# Patient Record
Sex: Male | Born: 1970 | Race: White | Hispanic: No | Marital: Married | Smoking: Former smoker
Health system: Southern US, Community
[De-identification: ages and names within clinical notes are randomized; demographics above are authoritative.]

## PROBLEM LIST (undated history)

## (undated) DIAGNOSIS — N200 Calculus of kidney: Secondary | ICD-10-CM

## (undated) DIAGNOSIS — E785 Hyperlipidemia, unspecified: Secondary | ICD-10-CM

## (undated) HISTORY — PX: CARDIAC CATHETERIZATION: SHX172

## (undated) HISTORY — PX: OTHER SURGICAL HISTORY: SHX169

---

## 1998-12-13 ENCOUNTER — Emergency Department (HOSPITAL_COMMUNITY): Admission: EM | Admit: 1998-12-13 | Discharge: 1998-12-13 | Payer: Self-pay | Admitting: Emergency Medicine

## 1998-12-13 ENCOUNTER — Encounter: Payer: Self-pay | Admitting: Emergency Medicine

## 1998-12-20 ENCOUNTER — Encounter: Payer: Self-pay | Admitting: Emergency Medicine

## 1998-12-20 ENCOUNTER — Emergency Department (HOSPITAL_COMMUNITY): Admission: EM | Admit: 1998-12-20 | Discharge: 1998-12-20 | Payer: Self-pay | Admitting: Emergency Medicine

## 2004-07-16 ENCOUNTER — Emergency Department (HOSPITAL_COMMUNITY): Admission: EM | Admit: 2004-07-16 | Discharge: 2004-07-16 | Payer: Self-pay | Admitting: Emergency Medicine

## 2006-01-06 ENCOUNTER — Ambulatory Visit: Payer: Self-pay | Admitting: Internal Medicine

## 2006-01-16 ENCOUNTER — Ambulatory Visit: Payer: Self-pay | Admitting: Internal Medicine

## 2006-01-20 ENCOUNTER — Inpatient Hospital Stay (HOSPITAL_BASED_OUTPATIENT_CLINIC_OR_DEPARTMENT_OTHER): Admission: RE | Admit: 2006-01-20 | Discharge: 2006-01-20 | Payer: Self-pay | Admitting: Internal Medicine

## 2006-01-20 ENCOUNTER — Ambulatory Visit: Payer: Self-pay | Admitting: Internal Medicine

## 2006-02-03 ENCOUNTER — Encounter: Payer: Self-pay | Admitting: Cardiology

## 2006-02-03 ENCOUNTER — Ambulatory Visit: Payer: Self-pay

## 2006-02-23 ENCOUNTER — Ambulatory Visit: Payer: Self-pay | Admitting: Gastroenterology

## 2006-10-09 ENCOUNTER — Ambulatory Visit: Payer: Self-pay | Admitting: Internal Medicine

## 2006-10-09 LAB — CONVERTED CEMR LAB
Basophils Absolute: 0.1 10*3/uL (ref 0.0–0.1)
Basophils Relative: 0.7 % (ref 0.0–1.0)
Eosinophils Absolute: 0.2 10*3/uL (ref 0.0–0.6)
Eosinophils Relative: 2.4 % (ref 0.0–5.0)
Free T4: 0.7 ng/dL (ref 0.6–1.6)
HCT: 39.5 % (ref 39.0–52.0)
Hemoglobin: 13.6 g/dL (ref 13.0–17.0)
Lymphocytes Relative: 40.9 % (ref 12.0–46.0)
MCHC: 34.5 g/dL (ref 30.0–36.0)
MCV: 88.4 fL (ref 78.0–100.0)
Monocytes Absolute: 0.5 10*3/uL (ref 0.2–0.7)
Monocytes Relative: 7 % (ref 3.0–11.0)
Neutro Abs: 3.5 10*3/uL (ref 1.4–7.7)
Neutrophils Relative %: 49 % (ref 43.0–77.0)
Platelets: 273 10*3/uL (ref 150–400)
RBC: 4.47 M/uL (ref 4.22–5.81)
RDW: 12 % (ref 11.5–14.6)
T3, Free: 3.2 pg/mL (ref 2.3–4.2)
TSH: 1.64 microintl units/mL (ref 0.35–5.50)
WBC: 7.3 10*3/uL (ref 4.5–10.5)

## 2007-01-11 ENCOUNTER — Ambulatory Visit: Payer: Self-pay | Admitting: Internal Medicine

## 2007-01-12 LAB — CONVERTED CEMR LAB
ALT: 20 units/L (ref 0–40)
AST: 20 units/L (ref 0–37)
Albumin: 4.2 g/dL (ref 3.5–5.2)
Alkaline Phosphatase: 44 units/L (ref 39–117)
Bilirubin, Direct: 0.1 mg/dL (ref 0.0–0.3)
Cholesterol: 249 mg/dL (ref 0–200)
Direct LDL: 177 mg/dL
HDL: 37.6 mg/dL — ABNORMAL LOW (ref >39.0)
Total Bilirubin: 0.8 mg/dL (ref 0.3–1.2)
Total CHOL/HDL Ratio: 6.6
Total Protein: 7.2 g/dL (ref 6.0–8.3)
Triglycerides: 151 mg/dL — ABNORMAL HIGH (ref 0–149)
VLDL: 30 mg/dL (ref 0–40)

## 2007-03-23 ENCOUNTER — Ambulatory Visit: Payer: Self-pay | Admitting: Internal Medicine

## 2007-03-23 LAB — CONVERTED CEMR LAB
ALT: 22 units/L (ref 0–53)
AST: 23 units/L (ref 0–37)
Albumin: 4.1 g/dL (ref 3.5–5.2)
Alkaline Phosphatase: 51 units/L (ref 39–117)
BUN: 9 mg/dL (ref 6–23)
Bilirubin, Direct: 0.1 mg/dL (ref 0.0–0.3)
CO2: 32 meq/L (ref 19–32)
Calcium: 9.2 mg/dL (ref 8.4–10.5)
Chloride: 107 meq/L (ref 96–112)
Cholesterol: 158 mg/dL (ref 0–200)
Creatinine, Ser: 1 mg/dL (ref 0.4–1.5)
GFR calc Af Amer: 109 mL/min
GFR calc non Af Amer: 90 mL/min
Glucose, Bld: 109 mg/dL — ABNORMAL HIGH (ref 70–99)
HDL: 30.9 mg/dL — ABNORMAL LOW (ref >39.0)
LDL Cholesterol: 113 mg/dL — ABNORMAL HIGH (ref 0–99)
Potassium: 3.9 meq/L (ref 3.5–5.1)
Sodium: 143 meq/L (ref 135–145)
Total Bilirubin: 0.7 mg/dL (ref 0.3–1.2)
Total CHOL/HDL Ratio: 5.1
Total Protein: 6.8 g/dL (ref 6.0–8.3)
Triglycerides: 70 mg/dL (ref 0–149)
VLDL: 14 mg/dL (ref 0–40)

## 2009-03-19 ENCOUNTER — Telehealth: Payer: Self-pay | Admitting: Internal Medicine

## 2009-03-19 DIAGNOSIS — R000 Tachycardia, unspecified: Secondary | ICD-10-CM | POA: Insufficient documentation

## 2009-03-23 ENCOUNTER — Encounter: Payer: Self-pay | Admitting: Internal Medicine

## 2009-03-23 ENCOUNTER — Ambulatory Visit: Payer: Self-pay | Admitting: Internal Medicine

## 2009-03-23 ENCOUNTER — Ambulatory Visit: Payer: Self-pay

## 2009-03-29 DIAGNOSIS — G479 Sleep disorder, unspecified: Secondary | ICD-10-CM | POA: Insufficient documentation

## 2009-03-29 DIAGNOSIS — Z8719 Personal history of other diseases of the digestive system: Secondary | ICD-10-CM | POA: Insufficient documentation

## 2009-03-29 DIAGNOSIS — Z9109 Other allergy status, other than to drugs and biological substances: Secondary | ICD-10-CM | POA: Insufficient documentation

## 2009-03-29 DIAGNOSIS — Z8679 Personal history of other diseases of the circulatory system: Secondary | ICD-10-CM | POA: Insufficient documentation

## 2009-03-30 ENCOUNTER — Encounter: Payer: Self-pay | Admitting: Internal Medicine

## 2009-03-30 ENCOUNTER — Ambulatory Visit: Payer: Self-pay | Admitting: Internal Medicine

## 2009-03-30 ENCOUNTER — Encounter (INDEPENDENT_AMBULATORY_CARE_PROVIDER_SITE_OTHER): Payer: Self-pay | Admitting: *Deleted

## 2009-03-30 DIAGNOSIS — I495 Sick sinus syndrome: Secondary | ICD-10-CM | POA: Insufficient documentation

## 2010-12-17 NOTE — Letter (Signed)
March 30, 2009     RE:  [place], [place]  MRN:  [id number]  /  DOB:  [birth year]   To Whom It May Concern:   Mr. 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  However, he had no symptoms while wearing the monitor.   Correlation between his arrhythmias that are documented and his symptoms  which are concerning is imperative.  I request the use of an event  recorder to try to help establish that correlation.   I thank you in advance for your consideration of this matter.    Sincerely,      Duke Salvia, MD, [facility]  Electronically Signed    SCK/MedQ  DD: 03/30/2009  DT: 03/30/2009  Job #: [id number]

## 2010-12-20 NOTE — Assessment & Plan Note (Signed)
[facility]                           GASTROENTEROLOGY OFFICE NOTE   NAME:[place], [place] E                      MRN:          [id number]  DATE:02/23/2006                            DOB:          [birth year]    NEW GI CONSULTATION:   REFERRING PHYSICIAN:  Bevelyn Buckles. Bensimhon, MD   PRIMARY CARE PHYSICIAN:  Hector Shade, P.A., at [facility].   REASON FOR REFERRAL:  Dr. Gala Romney asked me to evaluate Mr. Knippel  regarding noncardiac chest pain.   HISTORY OF PRESENT ILLNESS:  Mr. Rentz is a very pleasant 40 year old man  who has had 6 months of intermittent left upper quadrant/left lower chest  pains.  The pains come on sometimes after eating, sometimes when lying down.  They are not reliably associated with any types of food or exertion.  The  pain is sharp, sometimes pleuritic, lasting anywhere from 2 to 30 minutes.  The pain at first was going 3-4 times a week and at its worst was almost  daily.  Given his strong family history for coronary artery disease (his  father died at 33 from an acute MI), he first was evaluated by Dr. Gala Romney  in cardiology, eventually having a cardiac catheterization, which was  essentially normal.  About 6 weeks ago at the beginning of this workup, he  was started on Protonix and he has been taking it ever since.  He believes  that the pains have decreased in frequency.  At first they were happening 3-  4 times a week almost daily and now are happening once to twice a week at  most.  He takes the Protonix in the morning on an empty stomach and does not  eat until approximately lunch time.  The pain is not associated with nausea  or vomiting.  His bowels have been running normal.   Recent lab tests in June 2007 show normal CBC, normal basic metabolic  profile.   REVIEW OF SYSTEMS:  Essentially unrevealing and is available on his nursing  intake sheet.   PAST MEDICAL HISTORY:  1.  Elevated  cholesterol.  2.  Kidney stone surgery in the past.  3.  Tubes placed in his ears in the past.   CURRENT MEDICATIONS:  1.  Crestor.  2.  Protonix 40 mg daily.   ALLERGIES:  No known drug allergies.   SOCIAL HISTORY:  Married with 2 children, a son age 8 and a daughter age 44.  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  Nonsmoker.  Drinks 2-3 mixed drinks a week.   FAMILY HISTORY:  Strong heart disease.  No colon cancer or colon polyps in  the family.   PHYSICAL EXAMINATION:  VITAL SIGNS:  5 feet 11 inches, 187 pounds.  Blood  pressure 114/62, pulse 64.  CONSTITUTIONAL:  Generally well-appearing.  NEUROLOGIC:  Alert and oriented x3.  Affect is bright and mood is intact.  HEENT:  Mouth:  Oropharynx moist.  No lesions.  NECK:  Supple, no lymphadenopathy.  CARDIOVASCULAR:  Heart regular rate and rhythm.  LUNGS:  Clear to  auscultation bilaterally.  ABDOMEN:  Soft, nontender, nondistended.  Normal bowel sounds.  EXTREMITIES:  No lower extremity edema.  SKIN:  No rash or lesions on visible extremities.   ASSESSMENT AND PLAN:  A 40 year old man with left upper quadrant/left lower  chest intermittent discomfort.   First, his symptoms are not typical for gastroesophageal reflux disease but  given that they have improved in frequency since starting Protonix, they may  indeed be acid-related.  He does not take nonsteroidal anti-inflammatory  drugs on a regular basis and the character of the pain is unlike peptic  ulcer disease.  The way he is taking Protonix is probably not most  effective, and so I have recommended that he begin taking the medicine 20-30  minutes before his evening meal.  We are also going to give him a  gastroesophageal reflux disease handout to look over, and he will return to  see me in 4 weeks' time.  If he is still bothered by the symptoms at that  point after taking 1 month of Protonix at the correct time, we will have to  consider  further workup including further lab tests, possibly imaging  studies and/or endoscopy.  I am glad that he did get his cardiac workup  given his strong family history.                                   Rachael Fee, MD   DPJ/MedQ  DD:  02/23/2006  DT:  02/23/2006  Job #:  [id number]   cc:   Bevelyn Buckles. Bensimhon, MD

## 2010-12-20 NOTE — Cardiovascular Report (Signed)
NAME:  Mitchell Mcbride, Mitchell Mcbride               ACCOUNT NO.:  [id number]   MEDICAL RECORD NO.:  [id number]          PATIENT TYPE:  OIB   LOCATION:  NA                           FACILITY:  [facility]   PHYSICIAN:  Arvilla Meres, M.D. LHCDATE OF BIRTH:  [birth year]   DATE OF PROCEDURE:  01/20/2006  DATE OF DISCHARGE:                              CARDIAC CATHETERIZATION   PRIMARY CARE PHYSICIAN:  Hector Shade, who is a Building services engineer., at [facility].   CARDIOLOGIST:  Arvilla Meres, M.D.   IDENTIFICATION:  Mitchell Mcbride is a 40 year old male with a history of  hyperlipidemia and a very strong family history of coronary artery disease  with his  dad dying of a heart attack at 37.  He recently has been  experiencing chest pain with both stress and exertion.  He was seen in the  office and diagnostic options were reviewed, including stress testing,  cardiac CT or diagnostic cardiac catheterization.  We have decided on  catheterization.  I have explained the risks and benefits and a  catheterization was arranged in the outpatient lab.   PROCEDURES PERFORMED:  1.  Selective coronary angiography.  2.  Left heart catheterization.  3.  Left ventriculogram.   DESCRIPTION OF PROCEDURE:  Risks and benefits of catheterization were  explained.  Consent was signed and placed on the chart.  A 4-French arterial  sheath was placed in the right femoral artery using a modified Seldinger  technique.  Standard catheters including JL-4, 3-DRC and an angled pigtail  were used for the procedure.  All catheter exchanges were made over a wire.  There were no apparent complications.   Left main was short and wide.  It was angiographically normal.   LAD was a large vessel wrapping the apex.  It gave off three moderate to  large diagonals.  There was no angiographic CAD.   Left circumflex is a very large, dominant system.  It gave off a large ramus  branch, two large OMs, a large and a small posterolateral and large  PDA.  There is no angiographic CAD.   Right coronary artery was a small, nondominant vessel which was  angiographically normal.   Left ventriculogram done in the RAO position showed an EF of 55-60%.  There  was extensive ectopy.  There is a question of some very mild hypokinesis at  the base of the anterior wall but suspect this was artifactual.   ASSESSMENT:  1.  Normal coronary arteries.  2.  Normal left ventricular function with question of very mild anterior      basilar hypokinesis as described above.   DISCUSSION/PLAN:  I suspect his chest pain is noncardiac.  We will refer him  for GI evaluation.  Also check a 2-D echocardiogram to definitively assess  the anterior wall.      Arvilla Meres, M.D. [facility]  Electronically Signed     DB/MEDQ  D:  01/20/2006  T:  01/20/2006  Job:  [id number]   cc:   Hector Shade, P.A.  [facility]

## 2013-10-20 ENCOUNTER — Telehealth: Payer: Self-pay | Admitting: Cardiology

## 2013-10-20 DIAGNOSIS — R42 Dizziness and giddiness: Secondary | ICD-10-CM

## 2013-10-20 DIAGNOSIS — R6884 Jaw pain: Secondary | ICD-10-CM

## 2013-10-20 DIAGNOSIS — Z8249 Family history of ischemic heart disease and other diseases of the circulatory system: Secondary | ICD-10-CM

## 2013-10-20 DIAGNOSIS — R079 Chest pain, unspecified: Secondary | ICD-10-CM

## 2013-10-20 NOTE — Telephone Encounter (Signed)
Myoview scheduled for 10/25/13. Pts wife will let her husband know and give him instructions.

## 2013-10-20 NOTE — Telephone Encounter (Signed)
Spoke with pts wife Martie LeeSabrina who states her husband has had intermittent dizziness, CP, nausea and Jaw Pain over the last month. Symptoms have worsened. Pt has family hx of MI (father died at 3239 from MI). Per Dr. Eldridge DaceVaranasi pt should have an exercise myoview. Myoview ordered and pts wife will go over instructions with husband.

## 2013-10-24 NOTE — Addendum Note (Signed)
Addended byOrlene Plum: Tarryn Bogdan H on: 10/24/2013 02:59 PM   Modules accepted: Orders

## 2013-10-24 NOTE — Telephone Encounter (Signed)
Myoview denied by insurance. Echo and Gxt ordered per Dr. Eldridge DaceVaranasi.

## 2013-10-24 NOTE — Addendum Note (Signed)
Addended byOrlene Plum: Naiya Corral H on: 10/24/2013 03:41 PM   Modules accepted: Orders

## 2013-10-25 ENCOUNTER — Encounter (HOSPITAL_COMMUNITY): Payer: Self-pay

## 2013-10-25 ENCOUNTER — Other Ambulatory Visit: Payer: 59

## 2013-10-26 NOTE — Addendum Note (Signed)
Addended byOrlene Plum: Alyson Ki H on: 10/26/2013 01:33 PM   Modules accepted: Orders

## 2013-11-23 ENCOUNTER — Other Ambulatory Visit (HOSPITAL_COMMUNITY): Payer: 59

## 2013-11-28 ENCOUNTER — Other Ambulatory Visit (HOSPITAL_COMMUNITY): Payer: 59

## 2013-11-28 ENCOUNTER — Encounter: Payer: 59 | Admitting: Physician Assistant

## 2013-12-07 ENCOUNTER — Ambulatory Visit (HOSPITAL_COMMUNITY): Payer: 59 | Attending: Interventional Cardiology

## 2013-12-07 ENCOUNTER — Encounter (HOSPITAL_COMMUNITY): Payer: 59

## 2013-12-07 ENCOUNTER — Encounter: Payer: Self-pay | Admitting: Internal Medicine

## 2013-12-07 ENCOUNTER — Ambulatory Visit (HOSPITAL_BASED_OUTPATIENT_CLINIC_OR_DEPARTMENT_OTHER): Payer: 59 | Admitting: Radiology

## 2013-12-07 ENCOUNTER — Encounter: Payer: 59 | Admitting: Interventional Cardiology

## 2013-12-07 ENCOUNTER — Ambulatory Visit (INDEPENDENT_AMBULATORY_CARE_PROVIDER_SITE_OTHER): Payer: 59 | Admitting: Interventional Cardiology

## 2013-12-07 DIAGNOSIS — R072 Precordial pain: Secondary | ICD-10-CM

## 2013-12-07 DIAGNOSIS — R42 Dizziness and giddiness: Secondary | ICD-10-CM

## 2013-12-07 DIAGNOSIS — R079 Chest pain, unspecified: Secondary | ICD-10-CM

## 2013-12-07 DIAGNOSIS — Z8249 Family history of ischemic heart disease and other diseases of the circulatory system: Secondary | ICD-10-CM

## 2013-12-07 DIAGNOSIS — R6884 Jaw pain: Secondary | ICD-10-CM

## 2013-12-07 NOTE — Progress Notes (Signed)
Exercise Treadmill Test  Pre-Exercise Testing Evaluation Rhythm: normal sinus  Rate: 76  PR:  .16 QRS:  .07  QT:  .38 QTc: .42           Test  Exercise Tolerance Test Ordering MD: Varney DailyJaydeep Brick Ketcher, MD Interpreting MD: Varney DailyJaydeep Verl [place], MD  Unique Test No: 1  Treadmill:  1  Indication for ETT: chest pain - rule out ischemia  Contraindication to ETT: No   Stress Modality: exercise - treadmill  Cardiac Imaging Performed: non   Protocol: standard Bruce - maximal  Max BP:  184/73  Max MPHR (bpm):  178 85% MPR (bpm):  151  MPHR obtained (bpm):  179 % MPHR obtained:  100%  Reached 85% MPHR (min:sec):  8:00 Total Exercise Time (min-sec): 10:00  Workload in METS:  11.7 Borg Scale: 16  Reason ETT Terminated:  fatigue    ST Segment Analysis At Rest: normal ST segments - no evidence of significant ST depression With Exercise: no evidence of significant ST depression  Other Information Arrhythmia:  No Angina during ETT:  absent (0) Quality of ETT:  diagnostic  ETT Interpretation:  normal - no evidence of ischemia by ST analysis  Comments: No evidence of ischemia.  If he has further sx, he will let us know.  Some atypical short lived CP with a deep breath while on the treadmill.   Recommendations: Continue preventive therapy.  He should let us know if he has further exertional symptoms.    Corky CraftsJayadeep S Goldy Calandra

## 2013-12-07 NOTE — Progress Notes (Signed)
Echocardiogram performed.  

## 2014-03-04 ENCOUNTER — Emergency Department (HOSPITAL_COMMUNITY)
Admission: EM | Admit: 2014-03-04 | Discharge: 2014-03-04 | Disposition: A | Discharge status: Home / Self Care | Payer: 59 | Source: Home / Self Care | Attending: Emergency Medicine | Admitting: Emergency Medicine

## 2014-03-04 ENCOUNTER — Encounter (HOSPITAL_COMMUNITY): Payer: Self-pay | Admitting: Emergency Medicine

## 2014-03-04 ENCOUNTER — Emergency Department (HOSPITAL_COMMUNITY): Payer: 59

## 2014-03-04 DIAGNOSIS — Z8639 Personal history of other endocrine, nutritional and metabolic disease: Secondary | ICD-10-CM

## 2014-03-04 DIAGNOSIS — E785 Hyperlipidemia, unspecified: Secondary | ICD-10-CM | POA: Diagnosis present

## 2014-03-04 DIAGNOSIS — Z862 Personal history of diseases of the blood and blood-forming organs and certain disorders involving the immune mechanism: Secondary | ICD-10-CM | POA: Insufficient documentation

## 2014-03-04 DIAGNOSIS — S42409A Unspecified fracture of lower end of unspecified humerus, initial encounter for closed fracture: Principal | ICD-10-CM | POA: Diagnosis present

## 2014-03-04 DIAGNOSIS — Y9289 Other specified places as the place of occurrence of the external cause: Secondary | ICD-10-CM | POA: Insufficient documentation

## 2014-03-04 DIAGNOSIS — Z87891 Personal history of nicotine dependence: Secondary | ICD-10-CM

## 2014-03-04 DIAGNOSIS — S46909A Unspecified injury of unspecified muscle, fascia and tendon at shoulder and upper arm level, unspecified arm, initial encounter: Secondary | ICD-10-CM | POA: Insufficient documentation

## 2014-03-04 DIAGNOSIS — S42309A Unspecified fracture of shaft of humerus, unspecified arm, initial encounter for closed fracture: Secondary | ICD-10-CM | POA: Insufficient documentation

## 2014-03-04 DIAGNOSIS — W19XXXA Unspecified fall, initial encounter: Secondary | ICD-10-CM | POA: Diagnosis present

## 2014-03-04 DIAGNOSIS — Z87442 Personal history of urinary calculi: Secondary | ICD-10-CM

## 2014-03-04 DIAGNOSIS — S4980XA Other specified injuries of shoulder and upper arm, unspecified arm, initial encounter: Secondary | ICD-10-CM | POA: Insufficient documentation

## 2014-03-04 DIAGNOSIS — W64XXXA Exposure to other animate mechanical forces, initial encounter: Secondary | ICD-10-CM | POA: Insufficient documentation

## 2014-03-04 DIAGNOSIS — S42301A Unspecified fracture of shaft of humerus, right arm, initial encounter for closed fracture: Secondary | ICD-10-CM

## 2014-03-04 DIAGNOSIS — Y9389 Activity, other specified: Secondary | ICD-10-CM | POA: Insufficient documentation

## 2014-03-04 HISTORY — DX: Calculus of kidney: N20.0

## 2014-03-04 HISTORY — DX: Hyperlipidemia, unspecified: E78.5

## 2014-03-04 MED ORDER — OXYCODONE-ACETAMINOPHEN 5-325 MG PO TABS: 2.0000 | ORAL_TABLET | ORAL | Status: DC | PRN

## 2014-03-04 MED ORDER — HYDROMORPHONE HCL PF 1 MG/ML IJ SOLN
1.0000 mg | Freq: Once | INTRAMUSCULAR | Status: AC
  Administered 2014-03-04: 1 mg via INTRAVENOUS
  Filled 2014-03-04: qty 1

## 2014-03-04 MED ORDER — DIAZEPAM 5 MG/ML IJ SOLN
5.0000 mg | Freq: Once | INTRAMUSCULAR | Status: AC
  Administered 2014-03-04: 5 mg via INTRAVENOUS
  Filled 2014-03-04: qty 2

## 2014-03-04 MED ORDER — ONDANSETRON HCL 4 MG PO TABS: 4.0000 mg | ORAL_TABLET | Freq: Four times a day (QID) | ORAL | Status: DC

## 2014-03-04 MED ORDER — ONDANSETRON HCL 4 MG/2ML IJ SOLN
4.0000 mg | Freq: Once | INTRAMUSCULAR | Status: AC
  Administered 2014-03-04: 4 mg via INTRAVENOUS
  Filled 2014-03-04: qty 2

## 2014-03-04 NOTE — ED Notes (Signed)
X RAY at bedside 

## 2014-03-04 NOTE — Discharge Instructions (Signed)

## 2014-03-04 NOTE — ED Notes (Signed)
Patient transported to X-ray 

## 2014-03-04 NOTE — ED Notes (Signed)
Pt from horse [place] via [facility]  C/o a right arm injury. He was standing beside a horse she reared up kicked his right elbow and both horse and pt went to ground. Pt is unsure if horse stepped on his arm . Per EMS pt has crepitus to elbow. Pt received of fentanyl  And 4 mg of  Zofran IV.enroute via [facility]  Via left hand 18g IV.

## 2014-03-04 NOTE — ED Notes (Signed)
Patient's spouse refusing to have extremity splinted without speaking with MD.

## 2014-03-04 NOTE — ED Provider Notes (Signed)
CSN: [id number]     Arrival date & time 03/04/14  1830 History   First MD Initiated Contact with Patient 03/04/14 1856     Chief Complaint  Patient presents with  . Arm Injury     (Consider location/radiation/quality/duration/timing/severity/associated sxs/prior Treatment) Patient is a 43 y.o. male presenting with arm injury.  Arm Injury Location:  Arm Time since incident:  2 hours Injury: yes   Mechanism of injury comment:  Kicked by a horse Arm location:  R arm Pain details:    Quality:  Sharp   Radiates to:  Does not radiate   Severity:  Severe   Onset quality:  Sudden   Duration:  2 hours   Timing:  Constant   Progression:  Unchanged Chronicity:  New Prior injury to area:  No Relieved by: fentanyl given by EMS PTA. Worsened by:  Movement Associated symptoms: decreased range of motion   Associated symptoms: no neck pain, no numbness, no swelling and no tingling     Past Medical History  Diagnosis Date  . Hyperlipidemia   . Kidney stones    Past Surgical History  Procedure Laterality Date  . Stent for kidney stones     No family history on file. History  Substance Use Topics  . Smoking status: Never Smoker   . Smokeless tobacco: Not on file  . Alcohol Use: Yes     Comment: social    Review of Systems  Musculoskeletal: Negative for neck pain.  All other systems reviewed and are negative.     Allergies  Review of patient's allergies indicates no known allergies.  Home Medications   Prior to Admission medications   Medication Sig Start Date End Date Taking? Authorizing Provider  ibuprofen (ADVIL,MOTRIN) 200 MG tablet Take 800 mg by mouth every 6 (six) hours as needed (pain).   Yes Historical Provider, MD   BP 108/76  Pulse 91  Temp(Src) 97.7 F (36.5 C) (Oral)  Resp 18  Ht 6' (1.829 m)  Wt 200 lb (90.719 kg)  BMI 27.12 kg/m2  SpO2 98% Physical Exam  Nursing note and vitals reviewed. Constitutional: He is oriented to person, place, and time.  He appears well-developed and well-nourished. No distress.  HENT:  Head: Normocephalic and atraumatic. Head is without raccoon's eyes and without Battle's sign.  Nose: Nose normal.  Mouth/Throat: Oropharynx is clear and moist.  Eyes: Conjunctivae and EOM are normal. Pupils are equal, round, and reactive to light. No scleral icterus.  Neck: Neck supple. No spinous process tenderness and no muscular tenderness present.  Cardiovascular: Normal rate, regular rhythm, normal heart sounds and intact distal pulses.   No murmur heard. Pulmonary/Chest: Effort normal and breath sounds normal. No stridor. No respiratory distress. He has no wheezes. He has no rales. He exhibits no tenderness.  Abdominal: Soft. He exhibits no distension. There is no tenderness. There is no rebound and no guarding.  Musculoskeletal: Normal range of motion. He exhibits no edema.       Thoracic back: He exhibits no tenderness and no bony tenderness.       Lumbar back: He exhibits no tenderness and no bony tenderness.       Right upper arm: He exhibits tenderness (NV intact distally), swelling and deformity.  No evidence of trauma to extremities, except as noted.  2+ distal pulses.    Neurological: He is alert and oriented to person, place, and time.  Skin: Skin is warm and dry. No rash noted.  Psychiatric: He has  a normal mood and affect. His behavior is normal.    ED Course  Procedures (including critical care time) Labs Review Labs Reviewed - No data to display  Imaging Review Dg Elbow 2 Views Right  03/04/2014   CLINICAL DATA:  Right elbow pain following a fall off a horse today.  EXAM: RIGHT ELBOW - 2 VIEW  COMPARISON:  None.  FINDINGS: There is a fracture of the midshaft of the humerus with 1 shaft width of anterior displacement of the distal fragment and 1.8 cm of overlapping of the fragments. No elbow fracture or dislocation is seen. No elbow effusion is demonstrated.  IMPRESSION: Right humeral shaft fracture, as  described above.   Electronically Signed   By: Gordan Payment M.D.   On: 03/04/2014 19:31   Dg Forearm Right  03/04/2014   CLINICAL DATA:  Recent traumatic injury with pain  EXAM: RIGHT FOREARM - 2 VIEW  COMPARISON:  None.  FINDINGS: There is no evidence of fracture or other focal bone lesions. Soft tissues are unremarkable.  IMPRESSION: No acute abnormality is noted.   Electronically Signed   By: Alcide Clever M.D.   On: 03/04/2014 19:57   Dg Humerus Right  03/04/2014   CLINICAL DATA:  Fall off horse, right arm pain  EXAM: RIGHT HUMERUS - 2+ VIEW  COMPARISON:  None.  FINDINGS: Transverse fracture involving the distal third of the humeral shaft. Mild apex medial angulation. Possible mild overriding.  IMPRESSION: Transverse fracture involving the distal 3rd of the humeral shaft, as above.   Electronically Signed   By: Charline Bills M.D.   On: 03/04/2014 19:28  All radiology studies independently viewed by me.      EKG Interpretation None      MDM   Final diagnoses:  Right humeral fracture, closed, initial encounter    43 yo male presenting with right arm pain after being kicked by a horse.  He has a right humerus fracture.  He did not have any other injuries based on history and exam.  I discussed his fracture with Dr. Thomasena Edis (at pt's request, former patient for separate orthopedic problem).  Dr. Thomasena Edis requested that I call Dr. Lajoyce Corners.  I discussed fracture with Dr. Lajoyce Corners, who recommended placing patient in a splint and sling and having him follow up in clinic this week.  Pain treated with multiple doses of IV Dilaudid.      Candyce Churn III, MD 03/04/14 [phone]

## 2014-03-04 NOTE — ED Notes (Signed)
Bed: [id number] Expected date:  Expected time:  Means of arrival:  Comments: Hold for femur frx

## 2014-03-07 ENCOUNTER — Inpatient Hospital Stay (HOSPITAL_COMMUNITY): Payer: 59 | Admitting: Anesthesiology

## 2014-03-07 ENCOUNTER — Inpatient Hospital Stay (HOSPITAL_COMMUNITY)
Admission: AD | Admit: 2014-03-07 | Discharge: 2014-03-09 | DRG: 494 | Disposition: A | Discharge status: Home / Self Care | Payer: 59 | Source: Ambulatory Visit | Attending: Orthopedic Surgery | Admitting: Orthopedic Surgery

## 2014-03-07 ENCOUNTER — Encounter (HOSPITAL_COMMUNITY): Payer: 59 | Admitting: Anesthesiology

## 2014-03-07 ENCOUNTER — Encounter (HOSPITAL_COMMUNITY): Payer: Self-pay | Admitting: *Deleted

## 2014-03-07 ENCOUNTER — Encounter (HOSPITAL_COMMUNITY)
Admission: AD | Disposition: A | Discharge status: Home / Self Care | Payer: Self-pay | Source: Ambulatory Visit | Attending: Orthopedic Surgery

## 2014-03-07 DIAGNOSIS — S42409A Unspecified fracture of lower end of unspecified humerus, initial encounter for closed fracture: Secondary | ICD-10-CM

## 2014-03-07 DIAGNOSIS — S42401A Unspecified fracture of lower end of right humerus, initial encounter for closed fracture: Secondary | ICD-10-CM

## 2014-03-07 HISTORY — PX: ORIF HUMERUS FRACTURE: SHX2126

## 2014-03-07 SURGERY — OPEN REDUCTION INTERNAL FIXATION (ORIF) DISTAL HUMERUS FRACTURE
Anesthesia: General | Laterality: Right

## 2014-03-07 MED ORDER — PROPOFOL 10 MG/ML IV BOLUS
INTRAVENOUS | Status: AC
  Filled 2014-03-07: qty 20

## 2014-03-07 MED ORDER — ROCURONIUM BROMIDE 50 MG/5ML IV SOLN
INTRAVENOUS | Status: AC
  Filled 2014-03-07: qty 1

## 2014-03-07 MED ORDER — DEXTROSE 5 % IV SOLN: 500.0000 mg | Freq: Four times a day (QID) | INTRAVENOUS | Status: DC | PRN

## 2014-03-07 MED ORDER — MIDAZOLAM HCL 2 MG/2ML IJ SOLN
INTRAMUSCULAR | Status: AC
  Filled 2014-03-07: qty 2

## 2014-03-07 MED ORDER — CEFAZOLIN SODIUM-DEXTROSE 2-3 GM-% IV SOLR
INTRAVENOUS | Status: AC
  Filled 2014-03-07: qty 50

## 2014-03-07 MED ORDER — PROPOFOL 10 MG/ML IV BOLUS
INTRAVENOUS | Status: DC | PRN
  Administered 2014-03-07: 200 mg via INTRAVENOUS

## 2014-03-07 MED ORDER — HYDROMORPHONE HCL PF 1 MG/ML IJ SOLN
INTRAMUSCULAR | Status: AC
  Administered 2014-03-07: 1 mg
  Filled 2014-03-07: qty 1

## 2014-03-07 MED ORDER — LACTATED RINGERS IV SOLN
INTRAVENOUS | Status: DC | PRN
  Administered 2014-03-07 (×2): via INTRAVENOUS

## 2014-03-07 MED ORDER — CEFAZOLIN SODIUM 1-5 GM-% IV SOLN
1.0000 g | Freq: Three times a day (TID) | INTRAVENOUS | Status: DC
  Administered 2014-03-08 – 2014-03-09 (×4): 1 g via INTRAVENOUS
  Filled 2014-03-07 (×8): qty 50

## 2014-03-07 MED ORDER — FENTANYL CITRATE 0.05 MG/ML IJ SOLN
INTRAMUSCULAR | Status: DC | PRN
Start: 2014-03-07 — End: 2014-03-07
  Administered 2014-03-07: 25 ug via INTRAVENOUS
  Administered 2014-03-07 (×2): 50 ug via INTRAVENOUS
  Administered 2014-03-07: 25 ug via INTRAVENOUS

## 2014-03-07 MED ORDER — ONDANSETRON HCL 4 MG/2ML IJ SOLN
INTRAMUSCULAR | Status: AC
  Administered 2014-03-07: 4 mg
  Filled 2014-03-07: qty 2

## 2014-03-07 MED ORDER — ONDANSETRON HCL 4 MG PO TABS
4.0000 mg | ORAL_TABLET | Freq: Four times a day (QID) | ORAL | Status: DC | PRN
  Administered 2014-03-07: 4 mg via ORAL
  Filled 2014-03-07: qty 1

## 2014-03-07 MED ORDER — FENTANYL CITRATE 0.05 MG/ML IJ SOLN
INTRAMUSCULAR | Status: AC
  Filled 2014-03-07: qty 5

## 2014-03-07 MED ORDER — ONDANSETRON HCL 4 MG/2ML IJ SOLN
INTRAMUSCULAR | Status: DC | PRN
  Administered 2014-03-07: 4 mg via INTRAVENOUS

## 2014-03-07 MED ORDER — VITAMIN C 500 MG PO TABS
1000.0000 mg | ORAL_TABLET | Freq: Every day | ORAL | Status: DC
  Administered 2014-03-07 – 2014-03-09 (×3): 1000 mg via ORAL
  Filled 2014-03-07 (×3): qty 2

## 2014-03-07 MED ORDER — OXYCODONE-ACETAMINOPHEN 5-325 MG PO TABS
ORAL_TABLET | ORAL | Status: AC
  Administered 2014-03-07: 15:00:00
  Filled 2014-03-07: qty 1

## 2014-03-07 MED ORDER — MIDAZOLAM HCL 5 MG/5ML IJ SOLN
INTRAMUSCULAR | Status: DC | PRN
  Administered 2014-03-07: 2 mg via INTRAVENOUS

## 2014-03-07 MED ORDER — OXYCODONE-ACETAMINOPHEN 5-325 MG PO TABS
1.0000 | ORAL_TABLET | Freq: Once | ORAL | Status: DC
  Filled 2014-03-07: qty 1

## 2014-03-07 MED ORDER — LACTATED RINGERS IV SOLN
INTRAVENOUS | Status: DC
  Administered 2014-03-07: 16:00:00 via INTRAVENOUS

## 2014-03-07 MED ORDER — SENNA 8.6 MG PO TABS
1.0000 | ORAL_TABLET | Freq: Two times a day (BID) | ORAL | Status: DC
  Administered 2014-03-07 – 2014-03-09 (×4): 8.6 mg via ORAL
  Filled 2014-03-07 (×5): qty 1

## 2014-03-07 MED ORDER — 0.9 % SODIUM CHLORIDE (POUR BTL) OPTIME
TOPICAL | Status: DC | PRN
  Administered 2014-03-07: 1000 mL

## 2014-03-07 MED ORDER — FAMOTIDINE 20 MG PO TABS
20.0000 mg | ORAL_TABLET | Freq: Two times a day (BID) | ORAL | Status: DC | PRN
  Filled 2014-03-07: qty 1

## 2014-03-07 MED ORDER — SODIUM CHLORIDE 0.45 % IV SOLN
INTRAVENOUS | Status: DC
  Administered 2014-03-07: 20:00:00 via INTRAVENOUS

## 2014-03-07 MED ORDER — DIPHENHYDRAMINE HCL 25 MG PO CAPS
25.0000 mg | ORAL_CAPSULE | Freq: Four times a day (QID) | ORAL | Status: DC | PRN
  Administered 2014-03-08: 50 mg via ORAL
  Administered 2014-03-09: 25 mg via ORAL
  Filled 2014-03-07: qty 2
  Filled 2014-03-07: qty 1

## 2014-03-07 MED ORDER — OXYCODONE HCL 5 MG PO TABS
5.0000 mg | ORAL_TABLET | ORAL | Status: DC | PRN
  Administered 2014-03-07 – 2014-03-09 (×13): 10 mg via ORAL
  Filled 2014-03-07 (×13): qty 2

## 2014-03-07 MED ORDER — LIDOCAINE HCL (CARDIAC) 20 MG/ML IV SOLN
INTRAVENOUS | Status: DC | PRN
  Administered 2014-03-07: 30 mg via INTRAVENOUS

## 2014-03-07 MED ORDER — LIDOCAINE HCL (CARDIAC) 20 MG/ML IV SOLN
INTRAVENOUS | Status: AC
  Filled 2014-03-07: qty 5

## 2014-03-07 MED ORDER — FENTANYL CITRATE 0.05 MG/ML IJ SOLN
INTRAMUSCULAR | Status: AC
  Filled 2014-03-07: qty 2

## 2014-03-07 MED ORDER — METHOCARBAMOL 500 MG PO TABS
500.0000 mg | ORAL_TABLET | Freq: Four times a day (QID) | ORAL | Status: DC | PRN
  Administered 2014-03-07 – 2014-03-09 (×6): 500 mg via ORAL
  Filled 2014-03-07 (×7): qty 1

## 2014-03-07 MED ORDER — ALPRAZOLAM 0.5 MG PO TABS
0.5000 mg | ORAL_TABLET | Freq: Four times a day (QID) | ORAL | Status: DC | PRN
  Administered 2014-03-07 – 2014-03-09 (×6): 0.5 mg via ORAL
  Filled 2014-03-07 (×6): qty 1

## 2014-03-07 MED ORDER — ONDANSETRON HCL 4 MG/2ML IJ SOLN
INTRAMUSCULAR | Status: AC
  Filled 2014-03-07: qty 2

## 2014-03-07 MED ORDER — ONDANSETRON HCL 4 MG/2ML IJ SOLN
4.0000 mg | Freq: Four times a day (QID) | INTRAMUSCULAR | Status: DC | PRN
  Administered 2014-03-08: 4 mg via INTRAVENOUS
  Filled 2014-03-07: qty 2

## 2014-03-07 MED ORDER — MORPHINE SULFATE 2 MG/ML IJ SOLN
1.0000 mg | INTRAMUSCULAR | Status: DC | PRN
  Administered 2014-03-08: 1 mg via INTRAVENOUS
  Filled 2014-03-07: qty 1

## 2014-03-07 MED ORDER — CEFAZOLIN SODIUM-DEXTROSE 2-3 GM-% IV SOLR
2.0000 g | Freq: Once | INTRAVENOUS | Status: AC
  Administered 2014-03-07: 2 g via INTRAVENOUS

## 2014-03-07 MED ORDER — CEFAZOLIN SODIUM 1-5 GM-% IV SOLN
1.0000 g | INTRAVENOUS | Status: AC
  Administered 2014-03-07: 1 g via INTRAVENOUS
  Filled 2014-03-07 (×2): qty 50

## 2014-03-07 SURGICAL SUPPLY — 63 items
3.5MM LOCKING CORTICAL TAP ×1 IMPLANT
BANDAGE ELASTIC 3 VELCRO ST LF (GAUZE/BANDAGES/DRESSINGS) ×2 IMPLANT
BANDAGE ELASTIC 4 VELCRO ST LF (GAUZE/BANDAGES/DRESSINGS) ×3 IMPLANT
BIT DRILL 2.5X2.75 QC CALB (BIT) ×2 IMPLANT
BIT DRILL CALIBRATED 2.7 (BIT) ×2 IMPLANT
BIT DRILL CALIBRATED 2.7MM (BIT) ×2
BNDG CMPR 9X4 STRL LF SNTH (GAUZE/BANDAGES/DRESSINGS) ×1
BNDG ESMARK 4X9 LF (GAUZE/BANDAGES/DRESSINGS) ×3 IMPLANT
BNDG GAUZE ELAST 4 BULKY (GAUZE/BANDAGES/DRESSINGS) ×4 IMPLANT
CLOSURE STERI-STRIP 1/2X4 (GAUZE/BANDAGES/DRESSINGS) ×1
CLSR STERI-STRIP ANTIMIC 1/2X4 (GAUZE/BANDAGES/DRESSINGS) ×1 IMPLANT
CORDS BIPOLAR (ELECTRODE) ×3 IMPLANT
COVER MAYO STAND STRL (DRAPES) ×3 IMPLANT
COVER SURGICAL LIGHT HANDLE (MISCELLANEOUS) ×3 IMPLANT
CUFF TOURNIQUET SINGLE 18IN (TOURNIQUET CUFF) ×3 IMPLANT
CUFF TOURNIQUET SINGLE 24IN (TOURNIQUET CUFF) IMPLANT
DRAPE INCISE IOBAN 66X45 STRL (DRAPES) ×3 IMPLANT
DRAPE OEC MINIVIEW 54X84 (DRAPES) IMPLANT
DRSG ADAPTIC 3X8 NADH LF (GAUZE/BANDAGES/DRESSINGS) ×2 IMPLANT
DRSG MEPILEX BORDER 4X12 (GAUZE/BANDAGES/DRESSINGS) ×2 IMPLANT
GAUZE SPONGE 4X4 12PLY STRL (GAUZE/BANDAGES/DRESSINGS) IMPLANT
GAUZE XEROFORM 1X8 LF (GAUZE/BANDAGES/DRESSINGS) IMPLANT
GAUZE XEROFORM 5X9 LF (GAUZE/BANDAGES/DRESSINGS) ×2 IMPLANT
GLOVE BIOGEL M STRL SZ7.5 (GLOVE) ×3 IMPLANT
GLOVE SS BIOGEL STRL SZ 8 (GLOVE) ×1 IMPLANT
GLOVE SUPERSENSE BIOGEL SZ 8 (GLOVE) ×2
GOWN STRL REUS W/ TWL LRG LVL3 (GOWN DISPOSABLE) ×2 IMPLANT
GOWN STRL REUS W/ TWL XL LVL3 (GOWN DISPOSABLE) ×3 IMPLANT
GOWN STRL REUS W/TWL LRG LVL3 (GOWN DISPOSABLE) ×6
GOWN STRL REUS W/TWL XL LVL3 (GOWN DISPOSABLE) ×9
KIT BASIN OR (CUSTOM PROCEDURE TRAY) ×3 IMPLANT
KIT ROOM TURNOVER OR (KITS) ×3 IMPLANT
MANIFOLD NEPTUNE II (INSTRUMENTS) ×3 IMPLANT
NDL HYPO 25GX1X1/2 BEV (NEEDLE) IMPLANT
NEEDLE HYPO 25GX1X1/2 BEV (NEEDLE) IMPLANT
NS IRRIG 1000ML POUR BTL (IV SOLUTION) ×3 IMPLANT
PACK ORTHO EXTREMITY (CUSTOM PROCEDURE TRAY) ×3 IMPLANT
PAD ARMBOARD 7.5X6 YLW CONV (MISCELLANEOUS) ×6 IMPLANT
PAD CAST 4YDX4 CTTN HI CHSV (CAST SUPPLIES) IMPLANT
PADDING CAST COTTON 4X4 STRL (CAST SUPPLIES) ×3
PLATE LOCK COMP 9H 3.5 FOOT (Plate) ×2 IMPLANT
SCREW CORT 3.5X26 815037026 (Screw) ×2 IMPLANT
SCREW CORTICAL 3.5MM 22MM (Screw) ×2 IMPLANT
SCREW LOCK CORT STAR 3.5X18 (Screw) ×8 IMPLANT
SCREW LOCK CORT STAR 3.5X20 (Screw) ×4 IMPLANT
SCREW LOCK CORT STAR 3.5X22 (Screw) ×2 IMPLANT
SOLUTION BETADINE 4OZ (MISCELLANEOUS) ×3 IMPLANT
SPLINT FIBERGLASS 4X30 (CAST SUPPLIES) ×2 IMPLANT
SPONGE GAUZE 4X4 12PLY STER LF (GAUZE/BANDAGES/DRESSINGS) ×2 IMPLANT
SPONGE SCRUB IODOPHOR (GAUZE/BANDAGES/DRESSINGS) ×3 IMPLANT
SUCTION FRAZIER TIP 10 FR DISP (SUCTIONS) IMPLANT
SUT MERSILENE 4 0 P 3 (SUTURE) IMPLANT
SUT PROLENE 4 0 PS 2 18 (SUTURE) IMPLANT
SUT VIC AB 2-0 CT1 27 (SUTURE)
SUT VIC AB 2-0 CT1 TAPERPNT 27 (SUTURE) IMPLANT
SYR CONTROL 10ML LL (SYRINGE) IMPLANT
TAP 3.5 LOCK F/CORT SCRW (Screw) ×2 IMPLANT
TOWEL OR 17X24 6PK STRL BLUE (TOWEL DISPOSABLE) ×3 IMPLANT
TOWEL OR 17X26 10 PK STRL BLUE (TOWEL DISPOSABLE) ×6 IMPLANT
TUBE CONNECTING 12'X1/4 (SUCTIONS)
TUBE CONNECTING 12X1/4 (SUCTIONS) IMPLANT
UNDERPAD 30X30 INCONTINENT (UNDERPADS AND DIAPERS) ×3 IMPLANT
WATER STERILE IRR 1000ML POUR (IV SOLUTION) ×3 IMPLANT

## 2014-03-07 NOTE — Anesthesia Postprocedure Evaluation (Signed)
  Anesthesia Post-op Note  Patient: Mitchell Mcbride  Procedure(s) Performed: Procedure(s): OPEN REDUCTION INTERNAL FIXATION (ORIF) DISTAL HUMERUS FRACTURE (Right)  Patient Location: PACU  Anesthesia Type: General   Level of Consciousness: awake, alert  and oriented  Airway and Oxygen Therapy: Patient Spontanous Breathing  Post-op Pain: mild  Post-op Assessment: Post-op Vital signs reviewed  Post-op Vital Signs: Reviewed  Last Vitals:  Filed Vitals:   03/07/14 1930  BP: 130/84  Pulse: 66  Temp: 36.7 C  Resp: 10    Complications: No apparent anesthesia complications

## 2014-03-07 NOTE — Transfer of Care (Signed)
Immediate Anesthesia Transfer of Care Note  Patient: Mitchell Mcbride  Procedure(s) Performed: Procedure(s): OPEN REDUCTION INTERNAL FIXATION (ORIF) DISTAL HUMERUS FRACTURE (Right)  Patient Location: PACU  Anesthesia Type:General  Level of Consciousness: awake, alert  and oriented  Airway & Oxygen Therapy: Patient Spontanous Breathing and Patient connected to nasal cannula oxygen  Post-op Assessment: Report given to PACU RN and Post -op Vital signs reviewed and stable  Post vital signs: Reviewed and stable  Complications: No apparent anesthesia complications

## 2014-03-07 NOTE — H&P (Signed)
Mitchell Mcbride is an 43 y.o. male.   Chief Complaint: Broke my arm HPI: 43 yo male presents for surgical intervention to the right upper extremity. Patient sustained a distal humerus fracture over the weekend. He was seen in our office setting and noted to have a distal, transverse, displaced humerus fracture. Given his fracture pattern, operative intervention was recommended. Patient was noted to be neurovascularly intact.  Past Medical History  Diagnosis Date  . Hyperlipidemia   . Kidney stones     Past Surgical History  Procedure Laterality Date  . Stent for kidney stones    . Cardiac catheterization    . Fracture right ankle      History reviewed. No pertinent family history. Social History:  reports that he has quit smoking. He does not have any smokeless tobacco history on file. He reports that he drinks alcohol. He reports that he does not use illicit drugs.  Allergies: No Known Allergies  Medications Prior to Admission  Medication Sig Dispense Refill  . ibuprofen (ADVIL,MOTRIN) 200 MG tablet Take 800 mg by mouth every 6 (six) hours as needed (pain).      . ondansetron (ZOFRAN) 4 MG tablet Take 4 mg by mouth every 6 (six) hours. For nausea      . oxyCODONE-acetaminophen (PERCOCET/ROXICET) 5-325 MG per tablet Take 2 tablets by mouth every 4 (four) hours as needed for moderate pain or severe pain.        No results found for this or any previous visit (from the past 48 hour(s)). No results found.  Review of Systems  Constitutional: Negative.   HENT: Negative.   Eyes: Negative.   Respiratory: Negative.   Cardiovascular:       Hx of bradycardia  Gastrointestinal: Positive for heartburn.  Musculoskeletal:       See hpi  Skin: Negative.   Neurological: Negative.   Endo/Heme/Allergies: Negative.     Blood pressure 145/103, pulse 78, temperature 98.4 F (36.9 C), temperature source Oral, resp. rate 18, height 6' (1.829 m), weight 90.719 kg (200 lb), SpO2  98.00%. Physical Exam  Rue: R/U/M intact, long arm splint intact, excellent digital rom .Marland KitchenThe patient is alert and oriented in no acute distress the patient complains of pain in the affected upper extremity.  The patient is noted to have a normal HEENT exam.  Lung fields show equal chest expansion and no shortness of breath  abdomen exam is nontender without distention.  Lower extremity examination does not show any fracture dislocation or blood clot symptoms.  Pelvis is stable neck and back are stable and nontender  Assessment/Plan Right distal humerus fracture  .Marland KitchenWe are planning surgery for your upper extremity. The risk and benefits of surgery include risk of bleeding infection anesthesia damage to normal structures and failure of the surgery to accomplish its intended goals of relieving symptoms and restoring function with this in mind we'll going to proceed. I have specifically discussed with the patient the pre-and postoperative regime and the does and don'ts and risk and benefits in great detail. Risk and benefits of surgery also include risk of dystrophy chronic nerve pain failure of the healing process to go onto completion and other inherent risks of surgery The relavent the pathophysiology of the disease/injury process, as well as the alternatives for treatment and postoperative course of action has been discussed in great detail with the patient who desires to proceed.  We will do everything in our power to help you (the patient) restore function to  the upper extremity. Is a pleasure to see this patient today.   Andriea Hasegawa L 03/07/2014, 4:04 PM

## 2014-03-07 NOTE — Anesthesia Procedure Notes (Addendum)
Procedure Name: LMA Insertion Date/Time: 03/07/2014 4:35 PM Performed by: Leonel Ramsay'LAUGHLIN, KAREN H Pre-anesthesia Checklist: Patient identified, Timeout performed, Emergency Drugs available, Suction available and Patient being monitored Patient Re-evaluated:Patient Re-evaluated prior to inductionOxygen Delivery Method: Circle system utilized Preoxygenation: Pre-oxygenation with 100% oxygen Intubation Type: IV induction Ventilation: Mask ventilation without difficulty LMA: LMA inserted LMA Size: 5.0 Number of attempts: 1 Placement Confirmation: positive ETCO2 and breath sounds checked- equal and bilateral Tube secured with: Tape Dental Injury: Teeth and Oropharynx as per pre-operative assessment    Anesthesia Regional Block:  Interscalene brachial plexus block  Pre-Anesthetic Checklist: ,, timeout performed, Correct Patient, Correct Site, Correct Laterality, Correct Procedure, Correct Position, site marked, Risks and benefits discussed,  Surgical consent,  Pre-op evaluation,  At surgeon's request and post-op pain management  Laterality: Right  Prep: chloraprep       Needles:  Injection technique: Single-shot  Needle Type: Echogenic Stimulator Needle      Needle Gauge: 22 and 22 G    Additional Needles:  Procedures: ultrasound guided (picture in chart) and nerve stimulator Interscalene brachial plexus block Narrative:  Start time: 03/07/2014 4:10 PM End time: 03/07/2014 4:15 PM Injection made incrementally with aspirations every 5 mL.  Performed by: Personally   Additional Notes: 25 cc 0.5% Marcaine with 1:200 Epi injected easily

## 2014-03-07 NOTE — Op Note (Signed)
See op note#[id number] Mitchell Dullea md

## 2014-03-07 NOTE — Anesthesia Preprocedure Evaluation (Signed)
Anesthesia Evaluation  Patient identified by MRN, date of birth, ID band Patient awake    Reviewed: Allergy & Precautions, H&P , NPO status , Patient's Chart, lab work & pertinent test results  History of Anesthesia Complications Negative for: history of anesthetic complications  Airway Mallampati: II TM Distance: >3 FB Neck ROM: Full    Dental  (+) Teeth Intact, Dental Advisory Given   Pulmonary former smoker,          Cardiovascular     Neuro/Psych    GI/Hepatic   Endo/Other    Renal/GU      Musculoskeletal   Abdominal   Peds  Hematology   Anesthesia Other Findings   Reproductive/Obstetrics                           Anesthesia Physical Anesthesia Plan  ASA: II  Anesthesia Plan: General   Post-op Pain Management: MAC Combined w/ Regional for Post-op pain   Induction: Intravenous  Airway Management Planned: LMA  Additional Equipment:   Intra-op Plan:   Post-operative Plan: Extubation in OR  Informed Consent: I have reviewed the patients History and Physical, chart, labs and discussed the procedure including the risks, benefits and alternatives for the proposed anesthesia with the patient or authorized representative who has indicated his/her understanding and acceptance.   Dental advisory given  Plan Discussed with: Anesthesiologist and Surgeon  Anesthesia Plan Comments:         Anesthesia Quick Evaluation

## 2014-03-08 DIAGNOSIS — Z87891 Personal history of nicotine dependence: Secondary | ICD-10-CM | POA: Diagnosis not present

## 2014-03-08 DIAGNOSIS — S42409A Unspecified fracture of lower end of unspecified humerus, initial encounter for closed fracture: Secondary | ICD-10-CM | POA: Diagnosis present

## 2014-03-08 DIAGNOSIS — W19XXXA Unspecified fall, initial encounter: Secondary | ICD-10-CM | POA: Diagnosis present

## 2014-03-08 DIAGNOSIS — E785 Hyperlipidemia, unspecified: Secondary | ICD-10-CM | POA: Diagnosis present

## 2014-03-08 MED ORDER — HYDROMORPHONE HCL PF 1 MG/ML IJ SOLN
1.0000 mg | INTRAMUSCULAR | Status: DC | PRN
  Administered 2014-03-08 – 2014-03-09 (×6): 1 mg via INTRAVENOUS
  Filled 2014-03-08 (×6): qty 1

## 2014-03-08 NOTE — Progress Notes (Signed)
PT Cancellation Note  Patient Details Name: Mitchell Mcbride MRN: [id number] DOB: 1971/07/28   Cancelled Treatment:    Reason Eval/Treat Not Completed: PT screened, no needs identified, will sign off. Per OT, pt is independent with mobility, no PT indicated.    Ralene BatheUhlenberg, Mitchell Mcbride 03/08/2014, 11:06 AM [phone]

## 2014-03-08 NOTE — Op Note (Signed)
NAME:  Mitchell Mcbride, Mitchell Mcbride               ACCOUNT NO.:  [id number]  MEDICAL RECORD NO.:  [id number]  LOCATION:  5N12C                        FACILITY:  [facility]  PHYSICIAN:  Dionne AnoWilliam M. Zeffie Bickert, M.D.DATE OF BIRTH:  July 21, 1971  DATE OF PROCEDURE: DATE OF DISCHARGE:                              OPERATIVE REPORT   PREOPERATIVE DIAGNOSIS:  Right middistal third transverse humerus fracture with severe displacement.  POSTOPERATIVE DIAGNOSIS:  Right middistal third transverse humerus fracture with severe displacement.  PROCEDURES: 1. Open reduction and internal fixation with 9-hole 3.5 DCP plate from     [employer] right humeral shaft fracture, transverse in nature,     displaced. 2. Radial nerve exploration and neurolysis. 3. AP, lateral and oblique x-rays performed, examined and interpreted     by myself.  SURGEON:  Dionne AnoWilliam M. Amanda PeaGramig, M.D.  ASSISTANT:  Karie ChimeraBrian Buchanan, P.A.-C.  COMPLICATION:  None.  ANESTHESIA:  General with preoperative block.  TOURNIQUET TIME:  Zero.  ESTIMATED BLOOD LOSS:  Less than 100 mL.  INDICATIONS:  This patient is a 43 year old active male with a displaced humerus fracture.  He understands risks and benefits of the surgery and desires to proceed with the above-mentioned operative intervention. Given the transverse nature, distraction and angulation, we would recommend ORIF to try and provide him the best arm possible.  I have chosen an anterolateral approach with brachialis splitting technique due to the fact and feel this is the safest and less traumatic to the musculature.  OPERATION IN DETAIL:  The patient was seen by myself and Anesthesia, taken to the operative suite, underwent smooth induction of general anesthesia.  Preoperatively, a block was placed, antibiotics were given. He was prepped and draped in usual sterile fashion with Betadine scrub and paint, which myself and [name] supervised.  Once this was complete, the patient then underwent  sterile draping.  Time-out was called.  Operation commenced with an incision anterolaterally. Dissection was carried down.  Biceps was identified as was the lateral antebrachial cutaneous nerve branch and swept medially.  This allowed for identification of the brachialis given the fact that this was a distal third to mid third fracture.  We went ahead and performed identification of the radial nerve and a neurolysis.  The radial nerve was found at the elbow between the brachialis and brachioradialis.  It was identified and tracked proximally.  Once this was done and it was visually inspected and neurolysed, I then turned attention back towards the brachialis were an intermuscular split was made about the brachialis.  Retractors were placed, blunt in nature with laps to protect them and the fracture was accessed.  The fracture underwent identification.  I performed curettage, irrigation and following this, clamps were placed and the patient then underwent a very careful and cautious reduction.  Once this was done, a prebent 9-hole plate was placed, achieving eight cortices proximal and distal to the fracture. Initially, the patient had compression 3.5 screws placed followed by three additional locking screws above and below the fracture site.  This made the total of eight cortices.  Fracture interdigitated beautifully and x-rays of AP, lateral and oblique performed, examined and interpreted by myself, looked excellent.  This was a  4-view x-ray of the humerus.  Following this, the patient had excellent shoulder and elbow range of motion, and irrigation of greater than 2 liters was placed in the wound followed by closure of the brachialis splitting interval followed by closure of the subcu with Vicryl and skin edge with running subcuticular Prolene.  Following this, a Mepitel dressing followed by long-arm splint, sterile dressing and coaptation slabs were placed.  The patient tolerated this  well.  There were no complicating features.  He was taken to the recovery room in stable condition.  He had excellent pulse, soft compartments.  We will monitor his condition closely.  We will see him back in 12-14 days after his discharge from the hospital. He will be admitted for IV antibiotics of course.  He did quite well.  I want to go ahead and make sure that we have a therapy appointment immediately following next visit, as I want him in a long-arm splint.  I am not ready to go to a [place] given the fact that this will not control the fracture given the distal third type nature.  I feel he is better off in a height long-arm splint.  Thus, we will have a high long-arm splint placed in therapy and make sure things go smoothly for him.  We will not begin any aggressive measures until he is 6-8 weeks out and will wait on strengthening for 10- 12 weeks, he understands this.  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  Do's and don'ts have been discussed.  All questions have been encouraged and answered.     Dionne Ano. Amanda Pea, M.D.     [facility]  D:  03/07/2014  T:  03/08/2014  Job:  [id number]

## 2014-03-08 NOTE — Progress Notes (Signed)
Occupational Therapy Treatment Patient Details Name: Mitchell Mcbride MRN: [id number] DOB: [birth year] Today's Date: 03/08/2014    History of present illness s/p ORIF of R distal humerus   OT comments  Attempted to position pt's R UE in bed with support of scapula and in elevation.  Pt unable to tolerate more than 1 pillow.  Instructed and adjusted sling for comfort when ambulating.  Follow Up Recommendations  No OT follow up    Equipment Recommendations  None recommended by OT    Recommendations for Other Services      Precautions / Restrictions Precautions Precautions: Fall;Shoulder Type of Shoulder Precautions: elevate on 3 pillows, sling use, NWB R UE Shoulder Interventions: Shoulder sling/immobilizer Precaution Booklet Issued: Yes (comment) Precaution Comments: reviewed handout with family, pt with lethargy due to pain meds Required Braces or Orthoses: Sling Restrictions Weight Bearing Restrictions: Yes RUE Weight Bearing: Non weight bearing       Mobility Bed Mobility Overal bed mobility: Needs Assistance Bed Mobility: Supine to Sit;Sit to Supine     Supine to sit: Supervision;HOB elevated Sit to supine: Supervision;HOB elevated   General bed mobility comments: instructed pt to get up to L side of bed  Transfers Overall transfer level: Needs assistance   Transfers: Sit to/from Stand Sit to Stand: Supervision              Balance                                   ADL Overall ADL's : Needs assistance/impaired Eating/Feeding: Set up;Bed level   Grooming: Wash/dry Clinical cytogeneticist: Ambulation;Comfort height Network engineer Details (indicate cue type and reason): with IV pole Toileting- Clothing Manipulation and Hygiene: Sit to/from stand;Supervision/safety       Functional mobility during ADLs: Supervision/safety General ADL Comments: Instructed and  adjusted sling, difficult to gain adequate support due to degree of elbow flexion.      Vision                     Perception     Praxis      Cognition   Behavior During Therapy: WFL for tasks assessed/performed Overall Cognitive Status: Within Functional Limits for tasks assessed                       Extremity/Trunk Assessment    Exercises     Shoulder Instructions       General Comments      Pertinent Vitals/ Pain       6/10, R shoulder, repositioned and iced  Home Living Family/patient expects to be discharged to:: Private residence Living Arrangements: Spouse/significant other;Parent (mom will be with pt when spouse is at work) Available Help at Discharge: Family 24 hour                         Home Equipment: None          Prior Functioning/Environment Level of Independence: Independent        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   Frequency Min 2X/week     Progress Toward Goals  OT Goals(current goals can now be found in the care plan section)     Acute Rehab OT Goals Patient Stated Goal: regain  use of R UE for shooting his gun OT Goal Formulation: With patient Time For Goal Achievement: 03/15/14 Potential to Achieve Goals: Good ADL Goals Pt Will Perform Upper Body Bathing: with modified independence;sitting Pt Will Perform Upper Body Dressing: with modified independence;sitting Additional ADL Goal #1: Pt/family will be independent in R UE positioning in sling, chair and bed. Additional ADL Goal #2: Pt will verbalize understanding of edema management techiniques.  Plan Discharge plan remains appropriate    Co-evaluation                 End of Session     Activity Tolerance Patient limited by pain   Patient Left in bed;with call bell/phone within reach;with family/visitor present   Nurse Communication  (pain level 6/10)    Functional Assessment Tool Used: clinical judgement Functional Limitation: Self  care Self Care Current Status (Z6109(G8987): At least 20 percent but less than 40 percent impaired, limited or restricted Self Care Goal Status (U0454(G8988): At least 1 percent but less than 20 percent impaired, limited or restricted   Time: 1350-1420 OT Time Calculation (min): 30 min  Charges:  Self Care/Home Management : 8-22 mins $Therapeutic Activity: 8-22 min  Mitchell Mcbride, Mitchell Mcbride 03/08/2014, 2:45 PM

## 2014-03-08 NOTE — Progress Notes (Signed)
Subjective: 1 Day Post-Op Procedure(s) (LRB): OPEN REDUCTION INTERNAL FIXATION (ORIF) DISTAL HUMERUS FRACTURE (Right) Patient had a very difficult night secondary to pain control, apparently per the wife there was a significant lapse in receiving pain meds after surgery. The patient is currently improving with the advent of Dilaudid. Denies nausea/vomiting fever  Or chills  Objective: Vital signs in last 24 hours: Temp:  [98.2 F (36.8 C)-99.4 F (37.4 C)] 99.4 F (37.4 C) (08/05 2015) Pulse Rate:  [91-99] 99 (08/05 2015) Resp:  [16] 16 (08/05 2015) BP: (129-166)/(76-83) 129/83 mmHg (08/05 2015) SpO2:  [94 %-99 %] 94 % (08/05 2015)  Intake/Output from previous day: 08/04 0701 - 08/05 0700 In: 1831.3 [I.V.:1831.3] Out: 50 [Blood:50] Intake/Output this shift:    No results found for this basename: HGB,  in the last 72 hours No results found for this basename: WBC, RBC, HCT, PLT,  in the last 72 hours No results found for this basename: NA, K, CL, CO2, BUN, CREATININE, GLUCOSE, CALCIUM,  in the last 72 hours No results found for this basename: LABPT, INR,  in the last 72 hours  Patient is resting  Head atraumatic Chest: equal expansions present respirations nonlabored Abdomen: nontender Rue: splint clean and intact, radial nerve intact,median and ulnar nerves intact, sensation and refill intact, excellent digital rom  Assessment/Plan: 1 Day Post-Op Procedure(s) (LRB): OPEN REDUCTION INTERNAL FIXATION (ORIF) DISTAL HUMERUS FRACTURE (Right) Discussed with patients nurse all issues in regards to patients concerns and events last night and the need for better pain control, we will continue close observation, iv abx, pain management and continued admit. Most likely will d/c in am tomorrow  Rameen Quinney L 03/08/2014, 9:54 PM

## 2014-03-08 NOTE — Evaluation (Signed)
Occupational Therapy Evaluation Patient Details Name: Mitchell Mcbride MRN: [id number] DOB: [birth year] Today's Date: 03/08/2014    History of Present Illness s/p ORIF of R distal humerus   Clinical Impression   Limited evaluation due to pt's lethargy, likely medication related.  Instructed pt and family in edema management, sling use, and compensatory strategies for ADL and gave handout.  Pt not able to tolerate elevation of R UE above heart due to shoulder pain, ice applied to shoulder and encouraged pt to move fingers.  Pt has been routinely walking to bathroom with family with IV pole. Will follow.   Follow Up Recommendations  No OT follow up    Equipment Recommendations  None recommended by OT    Recommendations for Other Services       Precautions / Restrictions Precautions Precautions: Fall;Shoulder Type of Shoulder Precautions: elevate on 3 pillows, sling use, NWB R UE Shoulder Interventions: Shoulder sling/immobilizer Precaution Booklet Issued: Yes (comment) Precaution Comments: reviewed handout with family, pt with lethargy due to pain meds Required Braces or Orthoses: Sling Restrictions Weight Bearing Restrictions: Yes RUE Weight Bearing: Non weight bearing      Mobility Bed Mobility Overal bed mobility: Needs Assistance             General bed mobility comments: instructed pt to get up to L side of bed  Transfers Overall transfer level: Needs assistance   Transfers: Sit to/from Stand Sit to Stand: Supervision              Balance                                            ADL Overall ADL's : Needs assistance/impaired Eating/Feeding: Set up;Bed level   Grooming: Wash/dry Clinical cytogeneticist: Ambulation;Comfort height Network engineer Details (indicate cue type and reason): with IV pole         Functional mobility during ADLs:  Supervision/safety (with IV pole) General ADL Comments: Pt has been routinely walking to bathroom with supervision.  Pt was home with fracture x 3 days prior to admission and was performing ADL at a modified independent level with R UE in splint/sling.     Vision                     Perception     Praxis      Pertinent Vitals/Pain R shoulder, did not rate, premedicated      Hand Dominance Right   Extremity/Trunk Assessment Upper Extremity Assessment Upper Extremity Assessment: RUE deficits/detail RUE Deficits / Details: splinted from MPs to just distal to shoulder, able to move fingers RUE: Unable to fully assess due to immobilization RUE Coordination: decreased fine motor;decreased gross motor   Lower Extremity Assessment Lower Extremity Assessment: Overall WFL for tasks assessed   Cervical / Trunk Assessment Cervical / Trunk Assessment: Normal   Communication Communication Communication: No difficulties   Cognition Arousal/Alertness: Lethargic;Suspect due to medications Behavior During Therapy: [facility] for tasks assessed/performed Overall Cognitive Status: Within Functional Limits for tasks assessed                     General Comments       Exercises Exercises:  (AROM R fingers)     Shoulder Instructions  Home Living Family/patient expects to be discharged to:: Private residence Living Arrangements: Spouse/significant other;Parent (mom will be with pt when spouse is at work) [facility]                         Home Equipment: None          Prior Functioning/Environment Level of Independence: Independent        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    OT Diagnosis: Generalized weakness;Acute pain   OT Problem List: Decreased activity tolerance;Impaired balance (sitting and/or standing);Decreased safety awareness;Decreased knowledge of precautions;Impaired UE functional  use;Pain;Decreased range of motion;Decreased coordination;Increased edema   OT Treatment/Interventions: Self-care/ADL training;Patient/family education    OT Goals(Current goals can be found in the care plan section) Acute Rehab OT Goals Patient Stated Goal: regain use of R UE for shooting his gun OT Goal Formulation: With patient Time For Goal Achievement: 03/15/14 Potential to Achieve Goals: Good  OT Frequency: Min 2X/week   Barriers to D/C:            Co-evaluation              End of Session    Activity Tolerance: Patient limited by lethargy;Patient limited by pain Patient left: in bed;with call bell/phone within reach;with family/visitor present   Time: 1050-1109 OT Time Calculation (min): 19 min Charges:  OT General Charges $OT Visit: 1 Procedure OT Evaluation $Initial OT Evaluation Tier I: 1 Procedure OT Treatments $Self Care/Home Management : 8-22 mins G-Codes: OT G-codes **NOT FOR INPATIENT CLASS** Functional Assessment Tool Used: clinical judgement Functional Limitation: Self care Self Care Current Status (W0981(G8987): At least 20 percent but less than 40 percent impaired, limited or restricted Self Care Goal Status (X9147(G8988): At least 1 percent but less than 20 percent impaired, limited or restricted  Mitchell Mcbride, Mitchell Ruppe Lynn 03/08/2014, 12:59 PM [phone]

## 2014-03-08 NOTE — Progress Notes (Signed)
DR Juliene PinaGeoffrey called and verbal orders received, d/c morphine and give Dilaudid 1mg  q4h

## 2014-03-08 NOTE — Progress Notes (Signed)
Family member called out saying no pain relief at all from 10 mg oxycodone and 2 mg iv morphine. Paging answering service, redirected to [facility]. [facility] or orders. Wife of patient calls out to state patient is having pain worse than at initial accident and

## 2014-03-09 ENCOUNTER — Encounter (HOSPITAL_COMMUNITY): Payer: Self-pay | Admitting: Orthopedic Surgery

## 2014-03-09 MED ORDER — METHOCARBAMOL 500 MG PO TABS: 500.0000 mg | ORAL_TABLET | Freq: Three times a day (TID) | ORAL | Status: DC | PRN

## 2014-03-09 MED ORDER — HYDROMORPHONE HCL 2 MG PO TABS: 2.0000 mg | ORAL_TABLET | ORAL | Status: DC | PRN

## 2014-03-09 NOTE — Discharge Summary (Signed)
Physician Discharge Summary  Patient ID: Mitchell Mcbride MRN: [id number] DOB/AGE: 1970/09/15 43 y.o.  Admit date: 03/07/2014 Discharge date: 03/09/2014  Admission Diagnoses: Displaced right distal humerus fracture Patient Active Problem List   Diagnosis Date Noted  . Humerus distal fracture 03/07/2014  . SINUS BRADYCARDIA 03/30/2009  . UNSPECIFIED SLEEP DISTURBANCE 03/29/2009  . CHEST PAIN, HX OF 03/29/2009  . HEARTBURN, HX OF 03/29/2009  . ALLERGY, HX OF 03/29/2009  . UNSPECIFIED TACHYCARDIA 03/19/2009    Discharge Diagnoses:  status post open reduction internal fixation right distal humerus fracture  Patient Active Problem List   Diagnosis Date Noted  . Humerus distal fracture 03/07/2014  . SINUS BRADYCARDIA 03/30/2009  . UNSPECIFIED SLEEP DISTURBANCE 03/29/2009  . CHEST PAIN, HX OF 03/29/2009  . HEARTBURN, HX OF 03/29/2009  . ALLERGY, HX OF 03/29/2009  . UNSPECIFIED TACHYCARDIA 03/19/2009   Active Problems:   Humerus distal fracture   Discharged Condition: stable   Hospital Course: the patient is a pleasant 43 year old male who presented to our office setting for evaluation of right upper extremity after a fall he sustained this past weekend. He was noted sustained a displaced humerus fracture transverse in nature. Given his fracture alignment and the transverse nature of the fracture we discussed with the patient and his family at length our recommendations for surgical intervention. The patient underwent open reduction internal fixation about the right distal humerus fracture on August the without complicating features. Please see operative report for full details.   he was admitted to the orthopedic unit for close observation, IV antibiotics, and pain management. Postoperative day #1 the patient had an increased amount of pain however, it was noted that he had some difficulties with his pain control as it was a lapse of time upon his admit to the unit prior to receiving pain  medications. We discussed this with he and nursing staff at length. Her pain control was implemented the patient received by allotted which worked a considerable amount better then the morphine. Postoperative day #2 the patient's pain was better controlled, he was tolerating a diet, he was voiding without difficulties. He denied any nausea or vomiting. We discussed with him all discharge instructions. Decision was made to discharge him home to the care of his family  Consults: none  Treatments:       1. Open reduction and internal fixation with 9-hole 3.5 DCP plate from  [employer] right humeral shaft fracture, transverse in nature,  displaced.  2. Radial nerve exploration and neurolysis.                Discharge Exam: Blood pressure 123/79, pulse 89, temperature 98.5 F (36.9 C), temperature source Oral, resp. rate 16, height 6' (1.829 m), weight 90.719 kg (200 lb), SpO2 94.00%. Marland Kitchen..The patient is alert and oriented in no acute distress the patient complains of pain in the affected upper extremity.  The patient is noted to have a normal HEENT exam.  Lung fields show equal chest expansion and no shortness of breath  abdomen exam is nontender without distention.  Lower extremity examination does not show any fracture dislocation or blood clot symptoms.  Pelvis is stable neck and back are stable and nontender Evaluation of the right upper extremity shows his splint is clean dry and intact. He has excellent digital range of motion with no significant edema present. Sensation refill are intact. Radial and ulnar and median nerves are intact signs of infection present   Disposition: 01-Home or Self Care  Discharge  Instructions   Call MD / Call 911    Complete by:  As directed   If you experience chest pain or shortness of breath, CALL 911 and be transported to the hospital emergency room.  If you develope a fever above 101 F, pus (white drainage) or increased drainage or redness at the wound,  or calf pain, call your surgeon's office.     Constipation Prevention    Complete by:  As directed   Drink plenty of fluids.  Prune juice may be helpful.  You may use a stool softener, such as Colace (over the counter) 100 mg twice a day.  Use MiraLax (over the counter) for constipation as needed.     Diet - low sodium heart healthy    Complete by:  As directed      Discharge instructions    Complete by:  As directed   .Marland KitchenWe recommend that you to take vitamin C 1000 mg a day to promote healing we also recommend that if you require her pain medicine that he take a stool softener to prevent constipation as most pain medicines will have constipation side effects. We recommend either Peri-Colace or Senokot and recommend that you also consider adding MiraLAX to prevent the constipation affects from pain medicine if you are required to use them. These medicines are over the counter and maybe purchased at a local pharmacy. Marland Kitchen.Keep bandage clean and dry.  Call for any problems.  No smoking.  Criteria for driving a car: you should be off your pain medicine for 7-8 hours, able to drive one handed(confident), thinking clearly and feeling able in your judgement to drive. Continue elevation as it will decrease swelling.  If instructed by MD move your fingers within the confines of the bandage/splint.  Use ice if instructed by your MD. Call immediately for any sudden loss of feeling in your hand/arm or change in functional abilities of the extremity.     Increase activity slowly as tolerated    Complete by:  As directed             Medication List    STOP taking these medications       oxyCODONE-acetaminophen 5-325 MG per tablet  Commonly known as:  PERCOCET/ROXICET      TAKE these medications       HYDROmorphone 2 MG tablet  Commonly known as:  DILAUDID  Take 1 tablet (2 mg total) by mouth every 4 (four) hours as needed for severe pain (take 1 to 2 every 4 to 6 hours as needed for severe pain).      ibuprofen 200 MG tablet  Commonly known as:  ADVIL,MOTRIN  Take 800 mg by mouth every 6 (six) hours as needed (pain).     methocarbamol 500 MG tablet  Commonly known as:  ROBAXIN  Take 1 tablet (500 mg total) by mouth every 8 (eight) hours as needed for muscle spasms.     ondansetron 4 MG tablet  Commonly known as:  ZOFRAN  Take 4 mg by mouth every 6 (six) hours. For nausea           Follow-up Information   Schedule an appointment as soon as possible for a visit with Karen Chafe, MD. (call [phone] for any questions or concerns)    Specialty:  Orthopedic Surgery   Contact information:   [address] [place] [address] [phone]       Signed: Sheran Lawless 03/09/2014, 1:45 PM

## 2014-03-09 NOTE — Discharge Instructions (Signed)
Cast or Splint Care Casts and splints support injured limbs and keep bones from moving while they heal.  HOME CARE  Keep the cast or splint uncovered during the drying period.  A plaster cast can take 24 to 48 hours to dry.  A fiberglass cast will dry in less than 1 hour.  Do not rest the cast on anything harder than a pillow for 24 hours.  Do not put weight on your injured limb. Do not put pressure on the cast. Wait for your doctor's approval.  Keep the cast or splint dry.  Cover the cast or splint with a plastic bag during baths or wet weather.  If you have a cast over your chest and belly (trunk), take sponge baths until the cast is taken off.  If your cast gets wet, dry it with a towel or blow dryer. Use the cool setting on the blow dryer.  Keep your cast or splint clean. Wash a dirty cast with a damp cloth.  Do not put any objects under your cast or splint.  Do not scratch the skin under the cast with an object. If itching is a problem, use a blow dryer on a cool setting over the itchy area.  Do not trim or cut your cast.  Do not take out the padding from inside your cast.  Exercise your joints near the cast as told by your doctor.  Raise (elevate) your injured limb on 1 or 2 pillows for the first 1 to 3 days. GET HELP IF:  Your cast or splint cracks.  Your cast or splint is too tight or too loose.  You itch badly under the cast.  Your cast gets wet or has a soft spot.  You have a bad smell coming from the cast.  You get an object stuck under the cast.  Your skin around the cast becomes red or sore.  You have new or more pain after the cast is put on. GET HELP RIGHT AWAY IF:  You have fluid leaking through the cast.  You cannot move your fingers or toes.  Your fingers or toes turn blue or white or are cool, painful, or puffy (swollen).  You have tingling or lose feeling (numbness) around the injured area.  You have bad pain or pressure under the  cast.  You have trouble breathing or have shortness of breath.  You have chest pain. Document Released: 11/20/2010 Document Revised: 03/23/2013 Document Reviewed: 01/27/2013 [facility] Patient Information 2015 [place], [place]. This information is not intended to replace advice given to you by your health care provider. Make sure you discuss any questions you have with your health care provider. Marland Kitchen..Keep bandage clean and dry.  Call for any problems.  No smoking.  Criteria for driving a car: you should be off your pain medicine for 7-8 hours, able to drive one handed(confident), thinking clearly and feeling able in your judgement to drive. Continue elevation as it will decrease swelling.  If instructed by MD move your fingers within the confines of the bandage/splint.  Use ice if instructed by your MD. Call immediately for any sudden loss of feeling in your hand/arm or change in functional abilities of the extremity. Marland Kitchen..We recommend that you to take vitamin C 1000 mg a day to promote healing we also recommend that if you require her pain medicine that he take a stool softener to prevent constipation as most pain medicines will have constipation side effects. We recommend either Peri-Colace or Senokot and  recommend that you also consider adding MiraLAX to prevent the constipation affects from pain medicine if you are required to use them. These medicines are over the counter and maybe purchased at a local pharmacy.

## 2014-03-09 NOTE — Progress Notes (Signed)
Pt discharged to home. D/c instructions given. No questions verbalized. Vitals stable. 

## 2014-03-09 NOTE — Progress Notes (Signed)
Patient continues to rate his pain at six or above and is requiring IV dilaudid to control his pain. Pt and wife are concerned about him going home with only oxycodone for analgesia. Advised them to speak to MD in the morning about home pain meds. Pt is resting better this evening and is less anxious about his arm.

## 2014-03-09 NOTE — Progress Notes (Signed)
Occupational Therapy Treatment and Discharge Patient Details Name: Mitchell Mcbride MRN: [id number] DOB: 10/21/1970 Today's Date: 03/09/2014    History of present illness s/p ORIF of R distal humerus   OT comments  This 43 yo male admitted and underwent above presents to acute OT with increased pain and decreased AROM of RUE. All education of UBB/D, positioning and edema management covered--we will D/C from acute OT.  Follow Up Recommendations  No OT follow up    Equipment Recommendations  None recommended by OT       Precautions / Restrictions Precautions Precautions: Fall Type of Shoulder Precautions: elevate on 3 pillows, sling use, NWB R UE Shoulder Interventions: Shoulder sling/immobilizer Required Braces or Orthoses: Sling;Knee Immobilizer - Right (can have off if resting, but needs on when up and about and at night when sleeping) Restrictions Weight Bearing Restrictions: Yes RUE Weight Bearing: Non weight bearing       Mobility Transfers Overall transfer level: Needs assistance Equipment used: None Transfers: Sit to/from Stand Sit to Stand: Supervision                  ADL Overall ADL's : Needs assistance/impaired                                       General ADL Comments: mother in room. I went over dressing techniques with pt and mother (RUE in first and out last with stretchy t-shirt or tank top); washing, rinsing, drying, and applying deodorant under the right arm using "see-saw" method. Instructed pt and mother in proper positioning in bed (pt able to tolerate 2 pillows today)---arm at or above heart level to help with edema control and this is the way it should be whether in bed or in a chair (pt has a big chair with ottoman he has been sleeping in at home since the accident)                Cognition   Behavior During Therapy: [facility] for tasks assessed/performed Overall Cognitive Status: Within Functional Limits for tasks assessed                         Exercises Other Exercises Other Exercises: Instructed pt and mother in Right shoulder exercises (forward flexion and horizontal abduction/adduction to tolerance) to be completed 3x/day 10 reps each to help prevent shoulder from getting stiff/"frozen" Pt will full ROM of digits within constraints of casting.           Pertinent Vitals/ Pain       3/10 RUE; repostioned         Frequency Min 2X/week     Progress Toward Goals  OT Goals(current goals can now be found in the care plan section)  Progress towards OT goals: Goals met/education completed, patient discharged from [place] Discharge plan remains appropriate       End of Session Equipment Utilized During Treatment:  (sling)   Activity Tolerance Patient tolerated treatment well   Patient Left in bed;with call bell/phone within reach;with family/visitor present           Time: 7482-7078 OT Time Calculation (min): 23 min  Charges: OT General Charges $OT Visit: 1 Procedure OT Treatments $Self Care/Home Management : 8-22 mins $Therapeutic Exercise: 8-22 mins  Almon Register [phone] 03/09/2014, 9:52 AM

## 2014-03-13 ENCOUNTER — Encounter: Payer: Self-pay | Admitting: Emergency Medicine

## 2014-03-13 ENCOUNTER — Emergency Department
Admission: EM | Admit: 2014-03-13 | Discharge: 2014-03-13 | Disposition: A | Discharge status: Home / Self Care | Payer: 59 | Source: Home / Self Care | Attending: Emergency Medicine | Admitting: Emergency Medicine

## 2014-03-13 ENCOUNTER — Telehealth: Payer: Self-pay | Admitting: Emergency Medicine

## 2014-03-13 DIAGNOSIS — J039 Acute tonsillitis, unspecified: Secondary | ICD-10-CM

## 2014-03-13 MED ORDER — FLUCONAZOLE 150 MG PO TABS: ORAL_TABLET | ORAL | Status: DC

## 2014-03-13 MED ORDER — AMOXICILLIN-POT CLAVULANATE 875-125 MG PO TABS: 1.0000 | ORAL_TABLET | Freq: Two times a day (BID) | ORAL | Status: DC

## 2014-03-13 NOTE — ED Provider Notes (Addendum)
CSN: [id number]     Arrival date & time 03/13/14  1036 History   First MD Initiated Contact with Patient 03/13/14 1036     Chief Complaint  Patient presents with  . Sore Throat  . Hoarse  . Morning Sickness   Was hospitalized for fractured R arm/surgical repair last week; past 2 days intermittent fever with sore, hoarse throat. Progressively worsening.--Had some nausea but that's improved.   Here with mother  HPI SORE THROAT Onset: 2 days    Severity: moderate-severe Tried OTC meds without significant relief.  Symptoms:  + Fever  + Swollen neck glands No Recent Strep Exposure     No Myalgias No Headache No Rash  No Discolored Nasal Mucus No Allergy symptoms No sinus pain/pressure No itchy/red eyes No earache  No Drooling No Trismus  Minimal Nausea (post-op) No Vomiting No Abdominal pain No Diarrhea No Reflux symptoms  No Cough No Breathing Difficulty No Shortness of Breath No pleuritic pain No Wheezing No Hemoptysis   Past Medical History  Diagnosis Date  . Hyperlipidemia   . Kidney stones    Past Surgical History  Procedure Laterality Date  . Stent for kidney stones    . Cardiac catheterization    . Fracture right ankle    . Orif humerus fracture Right 03/07/2014    Procedure: OPEN REDUCTION INTERNAL FIXATION (ORIF) DISTAL HUMERUS FRACTURE;  Surgeon: Dominica Severin, MD;  Location: [facility] OR;  Service: Orthopedics;  Laterality: Right;   History reviewed. No pertinent family history. History  Substance Use Topics  . Smoking status: Former Games developer  . Smokeless tobacco: Former Neurosurgeon    Quit date: 03/08/2011  . Alcohol Use: 2.5 oz/week    5 drink(s) per week     Comment: social    Review of Systems  All other systems reviewed and are negative.   Allergies  Review of patient's allergies indicates no known allergies.  Home Medications   Prior to Admission medications   Medication Sig Start Date End Date Taking? Authorizing Provider   amoxicillin-clavulanate (AUGMENTIN) 875-125 MG per tablet Take 1 tablet by mouth 2 (two) times daily. For 10 days. Take with food. 03/13/14   Lajean Manes, MD  HYDROmorphone (DILAUDID) 2 MG tablet Take 1 tablet (2 mg total) by mouth every 4 (four) hours as needed for severe pain (take 1 to 2 every 4 to 6 hours as needed for severe pain). 03/09/14   Sheran Lawless, PA-C  ibuprofen (ADVIL,MOTRIN) 200 MG tablet Take 800 mg by mouth every 6 (six) hours as needed (pain).    Historical Provider, MD  methocarbamol (ROBAXIN) 500 MG tablet Take 1 tablet (500 mg total) by mouth every 8 (eight) hours as needed for muscle spasms. 03/09/14   Sheran Lawless, PA-C  ondansetron (ZOFRAN) 4 MG tablet Take 4 mg by mouth every 6 (six) hours. For nausea 03/04/14   Candyce Churn III, MD   BP 118/80  Pulse 77  Temp(Src) 97.4 F (36.3 C) (Oral)  Resp 16  Ht 6' (1.829 m)  Wt 210 lb (95.255 kg)  BMI 28.47 kg/m2  SpO2 100% Physical Exam  Nursing note and vitals reviewed. Constitutional: He is oriented to person, place, and time. He appears well-developed and well-nourished.  Non-toxic appearance. He appears ill. No distress.  HENT:  Head: Normocephalic and atraumatic.  Right Ear: Tympanic membrane, external ear and ear canal normal.  Left Ear: Tympanic membrane, external ear and ear canal normal.  Nose: Nose normal. Right sinus  exhibits no maxillary sinus tenderness and no frontal sinus tenderness. Left sinus exhibits no maxillary sinus tenderness and no frontal sinus tenderness.  Mouth/Throat: Uvula is midline and mucous membranes are normal. No oral lesions. Posterior oropharyngeal erythema present. No oropharyngeal exudate or tonsillar abscesses.  + Tonsillar enlargement.  Airway intact.  Eyes: Conjunctivae are normal. No scleral icterus.  Neck: Neck supple.  Cardiovascular: Normal rate, regular rhythm and normal heart sounds.   No murmur heard. Pulmonary/Chest: Effort normal and breath sounds normal. No  stridor. No respiratory distress. He has no wheezes. He has no rhonchi. He has no rales.  Abdominal: Soft. He exhibits no mass. There is no hepatosplenomegaly. There is no tenderness.  Lymphadenopathy:    He has cervical adenopathy.       Right cervical: Superficial cervical adenopathy present. No deep cervical and no posterior cervical adenopathy present.      Left cervical: Superficial cervical adenopathy present. No deep cervical and no posterior cervical adenopathy present.  Neurological: He is alert and oriented to person, place, and time.  Skin: Skin is warm. No rash noted.  Psychiatric: He has a normal mood and affect.    ED Course  Procedures (including critical care time) Labs Review Labs Reviewed  STREP A DNA PROBE  POCT RAPID STREP A (OFFICE)    Imaging Review No results found.   MDM   1. Acute tonsillitis    Rapid strep test negative. Treatment options discussed, as well as risks, benefits, alternatives. Patient voiced understanding and agreement with the following plans: Sendoff strep culture Antibiotic coverage with Augmentin 875 twice a day Other symptomatic care discussed  Follow-up with your primary care doctor in 5-7 days if not improving, or sooner if symptoms become worse. Precautions discussed. Red flags discussed. Questions invited and answered. Patient voiced understanding and agreement.  Addendum: On exam, patient also had whitish gray coating on tongue, consistent with thrush, likely from antibiotics pre-and post orthopedic surgery.--Prescribed Diflucan 150 mg, 2 by mouth stat, then starting tomorrow one daily for a total of 14 days   Lajean Manesavid Massey, MD 03/13/14 1133  Lajean Manesavid Massey, MD 03/13/14 [id number]

## 2014-03-13 NOTE — ED Notes (Signed)
Was hospitalized for fractured arm/surgical repair last week; past 2 days intermittent fever with sore, hoarse throat and some nausea.

## 2014-03-15 ENCOUNTER — Telehealth: Payer: Self-pay | Admitting: Emergency Medicine

## 2014-03-15 LAB — POCT RAPID STREP A (OFFICE): Rapid Strep A Screen: NEGATIVE

## 2014-03-15 LAB — STREP A DNA PROBE: GASP: NEGATIVE

## 2014-11-02 ENCOUNTER — Telehealth: Payer: Self-pay | Admitting: *Deleted

## 2014-11-02 DIAGNOSIS — Z8249 Family history of ischemic heart disease and other diseases of the circulatory system: Secondary | ICD-10-CM

## 2014-11-02 NOTE — Telephone Encounter (Signed)
He should have CMet and lipids done if not done within the last 6 months

## 2014-11-02 NOTE — Telephone Encounter (Signed)
-----   Message from Corky CraftsJayadeep S Varanasi, MD sent at 11/02/2014  5:16 PM EDT ----- He should have CMet and lipids if not done within the last year

## 2014-11-02 NOTE — Telephone Encounter (Signed)
Spoke with pt and made him aware that Dr. Eldridge DaceVaranasi would like for him to come in for labs.  Pt agreeable but states that he would call office tomorrow or Monday to schedule because he did not have his calendar with him.

## 2016-02-08 ENCOUNTER — Encounter: Payer: Self-pay | Admitting: Emergency Medicine

## 2016-02-08 ENCOUNTER — Emergency Department
Admission: EM | Admit: 2016-02-08 | Discharge: 2016-02-08 | Disposition: A | Discharge status: Home / Self Care | Payer: Commercial Managed Care - HMO | Source: Home / Self Care | Attending: Family Medicine | Admitting: Family Medicine

## 2016-02-08 DIAGNOSIS — B9789 Other viral agents as the cause of diseases classified elsewhere: Principal | ICD-10-CM

## 2016-02-08 DIAGNOSIS — J069 Acute upper respiratory infection, unspecified: Secondary | ICD-10-CM | POA: Diagnosis not present

## 2016-02-08 DIAGNOSIS — J309 Allergic rhinitis, unspecified: Secondary | ICD-10-CM

## 2016-02-08 MED ORDER — PREDNISONE 20 MG PO TABS: 20.0000 mg | ORAL_TABLET | Freq: Two times a day (BID) | ORAL | Status: DC

## 2016-02-08 MED ORDER — AZITHROMYCIN 250 MG PO TABS: ORAL_TABLET | ORAL | Status: DC

## 2016-02-08 MED ORDER — BENZONATATE 200 MG PO CAPS: 200.0000 mg | ORAL_CAPSULE | Freq: Every day | ORAL | Status: DC

## 2016-02-08 NOTE — ED Provider Notes (Signed)
CSN: [id number]     Arrival date & time 02/08/16  1230 History   First MD Initiated Contact with Patient 02/08/16 1328     Chief Complaint  Patient presents with  . Facial Pain  . Cough      HPI Comments: Patient complains of four day history of typical cold-like symptoms developing over several days,  including mild sore throat, sinus congestion, sneezing, headache, fatigue, and cough.  During the past two days he has had low grade fever to 99.5.  His ears feel clogged. He has a history of seasonal allergic rhinitis.  The history is provided by the patient and the spouse.    Past Medical History  Diagnosis Date  . Hyperlipidemia   . Kidney stones    Past Surgical History  Procedure Laterality Date  . Stent for kidney stones    . Cardiac catheterization    . Fracture right ankle    . Orif humerus fracture Right 03/07/2014    Procedure: OPEN REDUCTION INTERNAL FIXATION (ORIF) DISTAL HUMERUS FRACTURE;  Surgeon: Dominica SeverinWilliam Gramig, MD;  Location: [facility] OR;  Service: Orthopedics;  Laterality: Right;   History reviewed. No pertinent family history. Social History  Substance Use Topics  . Smoking status: Former Games developermoker  . Smokeless tobacco: Former NeurosurgeonUser    Quit date: 03/08/2011  . Alcohol Use: 2.5 oz/week    5 drink(s) per week     Comment: social    Review of Systems + sore throat + cough No pleuritic pain No wheezing + nasal congestion + post-nasal drainage + sinus pain/pressure No itchy/red eyes ? Earache; ears feel clogged No hemoptysis No SOB + low grade fever, + chills No nausea No vomiting No abdominal pain No diarrhea No urinary symptoms No skin rash + fatigue + myalgias + headache Used OTC meds without relief   Allergies  Review of patient's allergies indicates no known allergies.  Home Medications   Prior to Admission medications   Medication Sig Start Date End Date Taking? Authorizing Provider  azithromycin (ZITHROMAX Z-PAK) 250 MG tablet Take 2 tabs  today; then begin one tab once daily for 4 more days. (Rx void after 02/17/16) 02/08/16   Lattie HawStephen A Beese, MD  benzonatate (TESSALON) 200 MG capsule Take 1 capsule (200 mg total) by mouth at bedtime. Take as needed for cough 02/08/16   Lattie HawStephen A Beese, MD  fluconazole (DIFLUCAN) 150 MG tablet Take 2 by mouth now, then (starting tomorrow), 1 daily for a total of 14 days treatment.-For thrush. 03/13/14   Lajean Manesavid Massey, MD  HYDROmorphone (DILAUDID) 2 MG tablet Take 1 tablet (2 mg total) by mouth every 4 (four) hours as needed for severe pain (take 1 to 2 every 4 to 6 hours as needed for severe pain). 03/09/14   Karie ChimeraBrian Buchanan, PA-C  ibuprofen (ADVIL,MOTRIN) 200 MG tablet Take 800 mg by mouth every 6 (six) hours as needed (pain).    Historical Provider, MD  methocarbamol (ROBAXIN) 500 MG tablet Take 1 tablet (500 mg total) by mouth every 8 (eight) hours as needed for muscle spasms. 03/09/14   Karie ChimeraBrian Buchanan, PA-C  ondansetron (ZOFRAN) 4 MG tablet Take 4 mg by mouth every 6 (six) hours. For nausea 03/04/14   Blake DivineJohn Wofford, MD  predniSONE (DELTASONE) 20 MG tablet Take 1 tablet (20 mg total) by mouth 2 (two) times daily. Take with food. 02/08/16   Lattie HawStephen A Beese, MD   Meds Ordered and Administered this Visit  Medications - No data to display  BP 150/101 mmHg  Pulse 90  Temp(Src) 98 F (36.7 C) (Oral)  Wt 195 lb (88.451 kg)  SpO2 98% No data found.   Physical Exam Nursing notes and Vital Signs reviewed. Appearance:  Patient appears stated age, and in no acute distress Eyes:  Pupils are equal, round, and reactive to light and accomodation.  Extraocular movement is intact.  Conjunctivae are not inflamed  Ears:  Canals normal.  Tympanic membranes normal.  Nose:  Congested turbinates.  No sinus tenderness.  Transverse nasal crease present. Pharynx:  Uvula slightly edematous, otherwise normal Neck:  Supple.  Tender enlarged posterior/lateral nodes are palpated bilaterally  Lungs:  Clear to auscultation.  Breath  sounds are equal.  Moving air well. Heart:  Regular rate and rhythm without murmurs, rubs, or gallops.  Abdomen:  Nontender without masses or hepatosplenomegaly.  Bowel sounds are present.  No CVA or flank tenderness.  Extremities:  No edema.  Skin:  No rash present.   ED Course  Procedures none  MDM   1. Viral URI with cough   2. Allergic rhinitis, unspecified allergic rhinitis type     There is no evidence of bacterial infection today.   Begin prednisone burst.  Prescription written for Benzonatate (Tessalon) to take at bedtime for night-time cough.  Take plain guaifenesin (1200mg  extended release tabs such as Mucinex) twice daily, with plenty of water, for cough and congestion.  May add Pseudoephedrine (30mg , one or two every 4 to 6 hours) for sinus congestion.  Get adequate rest.   May use Afrin nasal spray (or generic oxymetazoline) twice daily for about 5 days and then discontinue.  Also recommend using saline nasal spray several times daily and saline nasal irrigation (AYR is a common brand).  Use Flonase nasal spray each morning after using Afrin nasal spray and saline nasal irrigation. Try warm salt water gargles for sore throat.  Stop all antihistamines for now, and other non-prescription cough/cold preparations. May take Ibuprofen 200mg , 4 tabs every 8 hours with food for headache, sore throat, etc. Begin Azithromycin if not improving about one week or if persistent fever develops (Given a prescription to hold, with an expiration date)  Follow-up with family doctor if not improving about10 days.     Lattie HawStephen A Beese, MD 02/08/16 1351

## 2016-02-08 NOTE — Discharge Instructions (Signed)
Take plain guaifenesin (1200mg  extended release tabs such as Mucinex) twice daily, with plenty of water, for cough and congestion.  May add Pseudoephedrine (30mg , one or two every 4 to 6 hours) for sinus congestion.  Get adequate rest.   May use Afrin nasal spray (or generic oxymetazoline) twice daily for about 5 days and then discontinue.  Also recommend using saline nasal spray several times daily and saline nasal irrigation (AYR is a common brand).  Use Flonase nasal spray each morning after using Afrin nasal spray and saline nasal irrigation. Try warm salt water gargles for sore throat.  Stop all antihistamines for now, and other non-prescription cough/cold preparations. May take Ibuprofen 200mg , 4 tabs every 8 hours with food for headache, sore throat, etc. Begin Azithromycin if not improving about one week or if persistent fever develops   Follow-up with family doctor if not improving about10 days.

## 2016-02-08 NOTE — ED Notes (Signed)
Pt c/o sinus pressure, HA, cough with mucous and intermittent low grade fevers x4 days.

## 2016-04-21 ENCOUNTER — Encounter: Payer: Self-pay | Admitting: Family Medicine

## 2016-04-21 ENCOUNTER — Ambulatory Visit (INDEPENDENT_AMBULATORY_CARE_PROVIDER_SITE_OTHER): Payer: Commercial Managed Care - HMO | Admitting: Family Medicine

## 2016-04-21 VITALS — BP 146/99 | HR 72 | Ht 71.0 in | Wt 205.0 lb

## 2016-04-21 DIAGNOSIS — M545 Low back pain, unspecified: Secondary | ICD-10-CM | POA: Insufficient documentation

## 2016-04-21 DIAGNOSIS — R030 Elevated blood-pressure reading, without diagnosis of hypertension: Secondary | ICD-10-CM | POA: Diagnosis not present

## 2016-04-21 DIAGNOSIS — IMO0001 Reserved for inherently not codable concepts without codable children: Secondary | ICD-10-CM

## 2016-04-21 NOTE — Patient Instructions (Addendum)
Thank you for coming in today. Get fasting labs soon.  Attend PT.  Return in 1 months with blood pressure log.    Lumbosacral Strain Lumbosacral strain is a strain of any of the parts that make up your lumbosacral vertebrae. Your lumbosacral vertebrae are the bones that make up the lower third of your backbone. Your lumbosacral vertebrae are held together by muscles and tough, fibrous tissue (ligaments).  CAUSES  A sudden blow to your back can cause lumbosacral strain. Also, anything that causes an excessive stretch of the muscles in the low back can cause this strain. This is typically seen when people exert themselves strenuously, fall, lift heavy objects, bend, or crouch repeatedly. RISK FACTORS  Physically demanding work.  Participation in pushing or pulling sports or sports that require a sudden twist of the back (tennis, golf, baseball).  Weight lifting.  Excessive lower back curvature.  Forward-tilted pelvis.  Weak back or abdominal muscles or both.  Tight hamstrings. SIGNS AND SYMPTOMS  Lumbosacral strain may cause pain in the area of your injury or pain that moves (radiates) down your leg.  DIAGNOSIS Your health care provider can often diagnose lumbosacral strain through a physical exam. In some cases, you may need tests such as X-ray exams.  TREATMENT  Treatment for your lower back injury depends on many factors that your clinician will have to evaluate. However, most treatment will include the use of anti-inflammatory medicines. HOME CARE INSTRUCTIONS   Avoid hard physical activities (tennis, racquetball, waterskiing) if you are not in proper physical condition for it. This may aggravate or create problems.  If you have a back problem, avoid sports requiring sudden body movements. Swimming and walking are generally safer activities.  Maintain good posture.  Maintain a healthy weight.  For acute conditions, you may put ice on the injured area.  Put ice in a  plastic bag.  Place a towel between your skin and the bag.  Leave the ice on for 20 minutes, 2-3 times a day.  When the low back starts healing, stretching and strengthening exercises may be recommended. SEEK MEDICAL CARE IF:  Your back pain is getting worse.  You experience severe back pain not relieved with medicines. SEEK IMMEDIATE MEDICAL CARE IF:   You have numbness, tingling, weakness, or problems with the use of your arms or legs.  There is a change in bowel or bladder control.  You have increasing pain in any area of the body, including your belly (abdomen).  You notice shortness of breath, dizziness, or feel faint.  You feel sick to your stomach (nauseous), are throwing up (vomiting), or become sweaty.  You notice discoloration of your toes or legs, or your feet get very cold. MAKE SURE YOU:   Understand these instructions.  Will watch your condition.  Will get help right away if you are not doing well or get worse.   This information is not intended to replace advice given to you by your health care provider. Make sure you discuss any questions you have with your health care provider.   Document Released: 04/30/2005 Document Revised: 08/11/2014 Document Reviewed: 03/09/2013 Elsevier Interactive Patient Education [employer]2016 Elsevier Inc.

## 2016-04-21 NOTE — Progress Notes (Signed)
Mitchell Mcbride is a 45 y.o. male who [facility] today to establish care.  Overall, patient is doing well.  He has no chronic medical conditions and doesn't take any medications.  He works for [employer].    His only complaint today is of left lower back pain that began 2 weeks ago.  He denies known injury or new activities.  He describes the pain as a dull squeeze.  The pain worsens throughout the day and is worst at night.  He denies radicular symptoms.  He has tried ibuprofen q4 hours and heat with improvement.  His pain is worse with movement, though can't specifically point to one movement in particular.  Laying flat and raising one leg improves his pain sometimes.  No fever, chills, weight loss, or fecal/urinary incontinence.  No urinary symptoms.    Of note, patient reports his father died of an MI at age 45.    Past Medical History:  Diagnosis Date  . Hyperlipidemia   . Kidney stones    Past Surgical History:  Procedure Laterality Date  . CARDIAC CATHETERIZATION    . fracture right ankle    . ORIF HUMERUS FRACTURE Right 03/07/2014   Procedure: OPEN REDUCTION INTERNAL FIXATION (ORIF) DISTAL HUMERUS FRACTURE;  Surgeon: Dominica SeverinWilliam Gramig, MD;  Location: [facility] OR;  Service: Orthopedics;  Laterality: Right;  . stent for kidney stones     Social History  Substance Use Topics  . Smoking status: Former Smoker    Types: Cigarettes    Quit date: 08/05/1991  . Smokeless tobacco: Former NeurosurgeonUser    Quit date: 03/08/2011  . Alcohol use 2.5 oz/week    5 Standard drinks or equivalent per week     Comment: social   family history includes Heart attack in his father.  ROS as above: No headache, visual changes, nausea, vomiting, diarrhea, constipation, dizziness, abdominal pain, skin rash, fevers, chills, night sweats, weight loss, swollen lymph nodes, body aches, joint  swelling, chest pain, shortness of breath, mood changes, visual or auditory hallucinations.    Medications: Current Outpatient Prescriptions  Medication Sig Dispense Refill  . ibuprofen (ADVIL,MOTRIN) 200 MG tablet Take 800 mg by mouth every 6 (six) hours as needed (pain).     No current facility-administered medications for this visit.    No Known Allergies   Exam:  BP (!) 146/99   Pulse 72   Ht 5\' 11"  (1.803 m)   Wt 205 lb (93 kg)   BMI 28.59 kg/m  Gen: Well NAD Back:  Normal range of motion Increased pain with full extension and lateral flexion Mild left lumbosacral muscle tenderness No spinal midline tenderness Strength, sensation, and reflexes intact in the bilateral lower extremities. Negative straight leg raise  No results found for this or any previous visit (from the past 24 hour(s)). No results found.    Assessment and Plan: 45 y.o. male who presents to establish care.  He is overall doing well.  He has only one health concern today, outlined below.  We will get general fasting labs (CBC, CMP, Lipids, A1c, TSH, Vitamin D)  1.  Left lower back pain without radiculopathy concerning for lumbosacral strain.  No red flag signs of symptoms. No urinary symptoms - Physical therapy referral - Continue ibuprofen and heat  2.  Elevated blood pressure: 146/99 in the clinic.   - Recheck in 1 month - Home BP log  3.  Health maintenance:  Will get flu shot at work.  Does not want Tdap at this time   Orders Placed This Encounter  Procedures  . CBC  . Comprehensive metabolic panel    Order Specific Question:   Has the patient fasted?    Answer:   No  . Lipid panel    Order Specific Question:   Has the patient fasted?    Answer:   No  . Hemoglobin A1c  . TSH  . VITAMIN D 25 Hydroxy (Vit-D Deficiency, Fractures)  . Ambulatory referral to Physical Therapy    Referral Priority:   Routine    Referral Type:   Physical Medicine    Referral Reason:   Specialty Services  Required    Requested Specialty:   Physical Therapy    Number of Visits Requested:   1    Discussed warning signs or symptoms. Please see discharge instructions. Patient expresses understanding.

## 2016-05-21 ENCOUNTER — Ambulatory Visit (INDEPENDENT_AMBULATORY_CARE_PROVIDER_SITE_OTHER): Payer: Commercial Managed Care - HMO | Admitting: Family Medicine

## 2016-05-21 VITALS — BP 142/91 | HR 65 | Wt 206.0 lb

## 2016-05-21 DIAGNOSIS — R030 Elevated blood-pressure reading, without diagnosis of hypertension: Secondary | ICD-10-CM | POA: Diagnosis not present

## 2016-05-21 DIAGNOSIS — M545 Low back pain, unspecified: Secondary | ICD-10-CM

## 2016-05-21 LAB — CBC
HCT: 39.5 % (ref 38.5–50.0)
HEMOGLOBIN: 13.5 g/dL (ref 13.2–17.1)
MCH: 29.6 pg (ref 27.0–33.0)
MCHC: 34.2 g/dL (ref 32.0–36.0)
MCV: 86.6 fL (ref 80.0–100.0)
MPV: 9.7 fL (ref 7.5–12.5)
Platelets: 273 10*3/uL (ref 140–400)
RBC: 4.56 MIL/uL (ref 4.20–5.80)
RDW: 13.1 % (ref 11.0–15.0)
WBC: 7.2 10*3/uL (ref 3.8–10.8)

## 2016-05-21 NOTE — Progress Notes (Signed)
       Italyhad E Wiehe is a 45 y.o. male who [facility] today for follow-up blood pressure and back pain.  At the last visit patient had elevated blood pressure and in the interim has been checking his blood pressure at home. He notes his typical systolic blood pressure ranges from the 120s to 130s and his diastolic into the 80s and 90s. He denies chest pain palpitations or shortness of breath. He feels well otherwise.  In the interim his back pain has resolved. He feels quite well and does not take any medications regularly for pain.  However Mr. Mayford KnifeWilliams has not obtain fasting labs yet. He is fasting today and will get labs drawn today.  He has not had influenza vaccine this year and he thinks it's been about 10 years since his last Tdap. He notes he can get these for free at work and will do so.   Past Medical History:  Diagnosis Date  . Hyperlipidemia   . Kidney stones    Past Surgical History:  Procedure Laterality Date  . CARDIAC CATHETERIZATION    . fracture right ankle    . ORIF HUMERUS FRACTURE Right 03/07/2014   Procedure: OPEN REDUCTION INTERNAL FIXATION (ORIF) DISTAL HUMERUS FRACTURE;  Surgeon: Dominica SeverinWilliam Gramig, MD;  Location: [facility] OR;  Service: Orthopedics;  Laterality: Right;  . stent for kidney stones     Social History  Substance Use Topics  . Smoking status: Former Smoker    Types: Cigarettes    Quit date: 08/05/1991  . Smokeless tobacco: Former NeurosurgeonUser    Quit date: 03/08/2011  . Alcohol use 2.5 oz/week    5 Standard drinks or equivalent per week     Comment: social   family history includes Heart attack in his father.  ROS as above:  Medications: Current Outpatient Prescriptions  Medication Sig Dispense Refill  . ibuprofen (ADVIL,MOTRIN) 200 MG tablet Take 800 mg by mouth every 6 (six) hours as needed (pain).     No current  facility-administered medications for this visit.    No Known Allergies  Health Maintenance Health Maintenance  Topic Date Due  . HIV Screening  04/23/1986  . INFLUENZA VACCINE  04/04/2017 (Originally 03/04/2016)  . TETANUS/TDAP  04/04/2017 (Originally 04/23/1990)     Exam:  BP (!) 142/91   Pulse 65   Wt 206 lb (93.4 kg)   BMI 28.73 kg/m  Gen: Well NAD HEENT: EOMI,  MMM Lungs: Normal work of breathing. CTABL Heart: RRR no MRG Abd: NABS, Soft. Nondistended, Nontender Exts: Brisk capillary refill, warm and well perfused.    No results found for this or any previous visit (from the past 72 hour(s)). No results found.    Assessment and Plan: 45 y.o. male with  Borderline hypertension: I suspect Mr. Mayford KnifeWilliams is blood pressures probably more elevated and he realizes. We'll continue watchful waiting for one year work on diet and exercise and weight loss. Continue home monitoring.  Back pain: Resolved doing well.  Health maintenance: Recommend Tdap and influenza vaccines at work. Patient will contact me when these have been done.  Labs are pending.   No orders of the defined types were placed in this encounter.   Discussed warning signs or symptoms. Please see discharge instructions. Patient expresses understanding.

## 2016-05-21 NOTE — Patient Instructions (Signed)
Thank you for coming in today. Keep an eye on your blood pressure.  Return in 1 year or sooner if needed for recheck.  Work on weight loss and try to get 30 mins of exercise daily.  This will help reduce blood pressure.   I will let you know ASAP about lab work results.

## 2016-05-22 ENCOUNTER — Encounter: Payer: Self-pay | Admitting: Family Medicine

## 2016-05-22 ENCOUNTER — Other Ambulatory Visit: Payer: Self-pay | Admitting: Family Medicine

## 2016-05-22 DIAGNOSIS — E785 Hyperlipidemia, unspecified: Secondary | ICD-10-CM | POA: Insufficient documentation

## 2016-05-22 DIAGNOSIS — E782 Mixed hyperlipidemia: Secondary | ICD-10-CM

## 2016-05-22 LAB — COMPREHENSIVE METABOLIC PANEL
ALBUMIN: 4.3 g/dL (ref 3.6–5.1)
ALK PHOS: 54 U/L (ref 40–115)
ALT: 19 U/L (ref 9–46)
AST: 18 U/L (ref 10–40)
BUN: 14 mg/dL (ref 7–25)
CALCIUM: 9 mg/dL (ref 8.6–10.3)
CO2: 26 mmol/L (ref 20–31)
Chloride: 106 mmol/L (ref 98–110)
Creat: 1.01 mg/dL (ref 0.60–1.35)
Glucose, Bld: 99 mg/dL (ref 65–99)
Potassium: 4.1 mmol/L (ref 3.5–5.3)
Sodium: 140 mmol/L (ref 135–146)
Total Bilirubin: 0.6 mg/dL (ref 0.2–1.2)
Total Protein: 6.7 g/dL (ref 6.1–8.1)

## 2016-05-22 LAB — VITAMIN D 25 HYDROXY (VIT D DEFICIENCY, FRACTURES): VIT D 25 HYDROXY: 32 ng/mL (ref 30–100)

## 2016-05-22 LAB — LIPID PANEL
Cholesterol: 229 mg/dL — ABNORMAL HIGH (ref 125–200)
HDL: 35 mg/dL — ABNORMAL LOW (ref >=40)
LDL CALC: 177 mg/dL — AB (ref <130)
TRIGLYCERIDES: 83 mg/dL (ref <150)
Total CHOL/HDL Ratio: 6.5 Ratio — ABNORMAL HIGH (ref <=5.0)
VLDL: 17 mg/dL (ref <30)

## 2016-05-22 LAB — HEMOGLOBIN A1C
Hgb A1c MFr Bld: 5.5 % (ref <5.7)
Mean Plasma Glucose: 111 mg/dL

## 2016-05-22 LAB — TSH: TSH: 2.01 mIU/L (ref 0.40–4.50)

## 2016-05-22 MED ORDER — ATORVASTATIN CALCIUM 20 MG PO TABS: 20.0000 mg | ORAL_TABLET | Freq: Every day | ORAL | 0 refills | Status: DC

## 2016-05-23 MED ORDER — ROSUVASTATIN CALCIUM 20 MG PO TABS: 20.0000 mg | ORAL_TABLET | Freq: Every day | ORAL | 0 refills | Status: DC

## 2016-12-22 ENCOUNTER — Ambulatory Visit (INDEPENDENT_AMBULATORY_CARE_PROVIDER_SITE_OTHER): Payer: Commercial Managed Care - HMO | Admitting: Family Medicine

## 2016-12-22 ENCOUNTER — Ambulatory Visit (INDEPENDENT_AMBULATORY_CARE_PROVIDER_SITE_OTHER): Payer: Commercial Managed Care - HMO

## 2016-12-22 ENCOUNTER — Encounter: Payer: Self-pay | Admitting: Family Medicine

## 2016-12-22 VITALS — BP 142/85 | HR 60 | Wt 199.0 lb

## 2016-12-22 DIAGNOSIS — M25562 Pain in left knee: Secondary | ICD-10-CM

## 2016-12-22 MED ORDER — DICLOFENAC SODIUM 1 % TD GEL: 4.0000 g | Freq: Four times a day (QID) | TRANSDERMAL | 11 refills | Status: DC

## 2016-12-22 NOTE — Progress Notes (Signed)
Mitchell Mcbride is a 46 y.o. male who presents to [facility] today for left lateral knee pain over the last 2 months. Started when hiking with daughter 2 months ago, gradually worsened in pain throughout hike. Hurts on lateral left, doesn't radiate. No swelling or pop at that time.  Relieved with rest.  Hurts whenever hikes on uneven ground, feels like his knee is giving out.  Hasn't been able to hike bc of the knee pain.  Denies prior injury or past imaging.      Past Medical History:  Diagnosis Date  . Hyperlipidemia   . Kidney stones    Past Surgical History:  Procedure Laterality Date  . CARDIAC CATHETERIZATION    . fracture right ankle    . ORIF HUMERUS FRACTURE Right 03/07/2014   Procedure: OPEN REDUCTION INTERNAL FIXATION (ORIF) DISTAL HUMERUS FRACTURE;  Surgeon: Dominica Severin, MD;  Location: [facility] OR;  Service: Orthopedics;  Laterality: Right;  . stent for kidney stones     Social History  Substance Use Topics  . Smoking status: Former Smoker    Types: Cigarettes    Quit date: 08/05/1991  . Smokeless tobacco: Former Neurosurgeon    Quit date: 03/08/2011  . Alcohol use 2.5 oz/week    5 Standard drinks or equivalent per week     Comment: social     ROS:  As above   Medications: Current Outpatient Prescriptions  Medication Sig Dispense Refill  . ibuprofen (ADVIL,MOTRIN) 200 MG tablet Take 800 mg by mouth every 6 (six) hours as needed (pain).    Marland Kitchen diclofenac sodium (VOLTAREN) 1 % GEL Apply 4 g topically 4 (four) times daily. To affected joint. 100 g 11  . rosuvastatin (CRESTOR) 20 MG tablet Take 1 tablet (20 mg total) by mouth daily. (Patient not taking: Reported on 12/22/2016) 90 tablet 0   No current facility-administered medications for this visit.    No Known Allergies   Exam:  BP (!) 142/85   Pulse 60   Wt 199 lb (90.3 kg)   BMI 27.75 kg/m  General: Well Developed, well nourished, and in no acute distress.  Neuro/Psych:  Alert and oriented x3, extra-ocular muscles intact, able to move all 4 extremities, sensation grossly intact. Skin: Warm and dry, no rashes noted.  Respiratory: Not using accessory muscles, speaking in full sentences, trachea midline.  Cardiovascular: Pulses palpable, no extremity edema. Abdomen: Does not appear distended. MSK:  Left Knee:  No obvious deformities, no crepitations.  Pain with palpitation of lateral joint line Full ROM, strength Negative anterior posterior drawer test. Stable ligamentous exam Mildly positive lateral McMurray's test Intact extension and flexion strength       No results found for this or any previous visit (from the past 48 hour(s)). Dg Knee 1-2 Views Right  Result Date: 12/22/2016 CLINICAL DATA:  Left lateral knee pain for 2 months.  Trail hiking. EXAM: RIGHT KNEE - 1-2 VIEW; LEFT KNEE - COMPLETE 4+ VIEW COMPARISON:  None. FINDINGS: No evidence of fracture, malalignment, or joint effusion. No evidence of arthropathy or other focal bone abnormality. Soft tissues are unremarkable. Negative AP views of the right knee. IMPRESSION: Negative knee series. Electronically Signed   By: Marnee Spring M.D.   On: 12/22/2016 14:09   Dg Knee Complete 4 Views Left  Result Date: 12/22/2016 CLINICAL DATA:  Left lateral knee pain for 2 months.  Trail hiking. EXAM: RIGHT KNEE - 1-2 VIEW; LEFT KNEE - COMPLETE 4+ VIEW  COMPARISON:  None. FINDINGS: No evidence of fracture, malalignment, or joint effusion. No evidence of arthropathy or other focal bone abnormality. Soft tissues are unremarkable. Negative AP views of the right knee. IMPRESSION: Negative knee series. Electronically Signed   By: Marnee SpringJonathon  Watts M.D.   On: 12/22/2016 14:09      Assessment and Plan: 46 y.o. male with left lateral knee pain for 2 months, likely distal IT band tendonitis vs lateral meniscus injury.  Knee xray did not demonstrate any signs of degenerative changes, making arthritis  unlikely. Location of pain may be consistent with distal IT band tendonitis, will try topical diclofenac and PT to strengthen joint with goal of resuming hiking in a couple weeks.  Consider MRI in future to eval mensicus injury should PT and anti-inflammatories not improve symptoms.     Orders Placed This Encounter  Procedures  . DG Knee 1-2 Views Right    Standing Status:   Future    Number of Occurrences:   1    Standing Expiration Date:   02/22/2018    Order Specific Question:   Reason for Exam (SYMPTOM  OR DIAGNOSIS REQUIRED)    Answer:   For use with the left knee x-ray bilateral AP and Rosenberg standing.    Order Specific Question:   Preferred imaging location?    Answer[facility]   [facility]  . DG Knee Complete 4 Views Left    Please include patellar sunrise, lateral, and weightbearing bilateral AP and bilateral rosenberg views    Standing Status:   Future    Number of Occurrences:   1    Standing Expiration Date:   02/21/2018    Order Specific Question:   Reason for exam:    Answer:   Please include patellar sunrise, lateral, and weightbearing bilateral AP and bilateral rosenberg views    Comments:   Please include patellar sunrise, lateral, and weightbearing bilateral AP and bilateral rosenberg views    Order Specific Question:   Preferred imaging location?    Answer:   [facility]  . Ambulatory referral to Physical Therapy    Referral Priority:   Routine    Referral Type:   Physical Medicine    Referral Reason:   Specialty Services Required    Requested Specialty:   Physical Therapy    Number of Visits Requested:   1   Meds ordered this encounter  Medications  . diclofenac sodium (VOLTAREN) 1 % GEL    Sig: Apply 4 g topically 4 (four) times daily. To affected joint.    Dispense:  100 g    Refill:  11    Discussed warning signs or symptoms. Please see discharge instructions. Patient expresses understanding.

## 2016-12-22 NOTE — Patient Instructions (Addendum)
Thank you for coming in today. Attend PT.  Apply voltaren gel 4x daily.  Do the cross over stretch  Do the figure 4 stretch Do the standing knee bent stretch   Meniscus Tear A meniscus tear is a knee injury in which a piece of the meniscus is torn. The meniscus is a thick, rubbery, wedge-shaped cartilage in the knee. Two menisci are located in each knee. They sit between the upper bone (femur) and lower bone (tibia) that make up the knee joint. Each meniscus acts as a shock absorber for the knee. A torn meniscus is one of the most common types of knee injuries. This injury can range from mild to severe. Surgery may be needed for a severe tear. What are the causes? This injury may be caused by any squatting, twisting, or pivoting movement. Sports-related injuries are the most common cause. These often occur from:  Running and stopping suddenly.  Changing direction.  Being tackled or knocked off your feet. As people get older, their meniscus gets thinner and weaker. In these people, tears can happen more easily, such as from climbing stairs. What increases the risk? This injury is more likely to happen to:  People who play contact sports.  Males.  People who are 46 years of age. What are the signs or symptoms? Symptoms of this injury include:  Knee pain, especially at the side of the knee joint. You may feel pain when the injury occurs, or you may only hear a pop and feel pain later.  A feeling that your knee is clicking, catching, locking, or giving way.  Not being able to fully bend or extend your knee.  Bruising or swelling in your knee. How is this diagnosed? This injury may be diagnosed based on your symptoms and a physical exam. The physical exam may include:  Moving your knee in different ways.  Feeling for tenderness.  Listening for a clicking sound.  Checking if your knee locks or catches. You may also have tests, such as:  X-rays.  MRI.  A procedure to  look inside your knee with a narrow surgical telescope (arthroscopy). You may be referred to a knee specialist (orthopedic surgeon). How is this treated? Treatment for this injury depends on the severity of the tear. Treatment for a mild tear may include:  Rest.  Medicine to reduce pain and swelling. This is usually a nonsteroidal anti-inflammatory drug (NSAID).  A knee brace or an elastic sleeve or wrap.  Using crutches or a walker to keep weight off your knee and to help you walk.  Exercises to strengthen your knee (physical therapy). You may need surgery if you have a severe tear or if other treatments are not working. Follow these instructions at home: Managing pain and swelling   Take over-the-counter and prescription medicines only as told by your health care provider.  If directed, apply ice to the injured area:  Put ice in a plastic bag.  Place a towel between your skin and the bag.  Leave the ice on for 20 minutes, 2-3 times per day.  Raise (elevate) the injured area above the level of your heart while you are sitting or lying down. Activity   Do not use the injured limb to support your body weight until your health care provider says that you can. Use crutches or a walker as told by your health care provider.  Return to your normal activities as told by your health care provider. Ask your health care provider  what activities are safe for you.  Perform range-of-motion exercises only as told by your health care provider.  Begin doing exercises to strengthen your knee and leg muscles only as told by your health care provider. After you recover, your health care provider may recommend these exercises to help prevent another injury. General instructions   Use a knee brace or elastic wrap as told by your health care provider.  Keep all follow-up visits as told by your health care provider. This is important. Contact a health care provider if:  You have a  fever.  Your knee becomes red, tender, or swollen.  Your pain medicine is not helping.  Your symptoms get worse or do not improve after 2 weeks of home care. This information is not intended to replace advice given to you by your health care provider. Make sure you discuss any questions you have with your health care provider. Document Released: 10/11/2002 Document Revised: 12/27/2015 Document Reviewed: 11/13/2014 Elsevier Interactive Patient Education  2017 [employer]

## 2016-12-23 ENCOUNTER — Encounter: Payer: Self-pay | Admitting: Family Medicine

## 2017-01-06 ENCOUNTER — Ambulatory Visit: Payer: Commercial Managed Care - HMO | Admitting: Physical Therapy

## 2018-01-06 IMAGING — DX DG KNEE COMPLETE 4+V*L*
4 series · 4 of 4 positions shown · non-contrast
Comparison: None.

CLINICAL DATA: Left lateral knee pain for 2 months.  Trail hiking.

EXAM:
RIGHT KNEE - 1-2 VIEW; LEFT KNEE - COMPLETE 4+ VIEW

[tunnel]
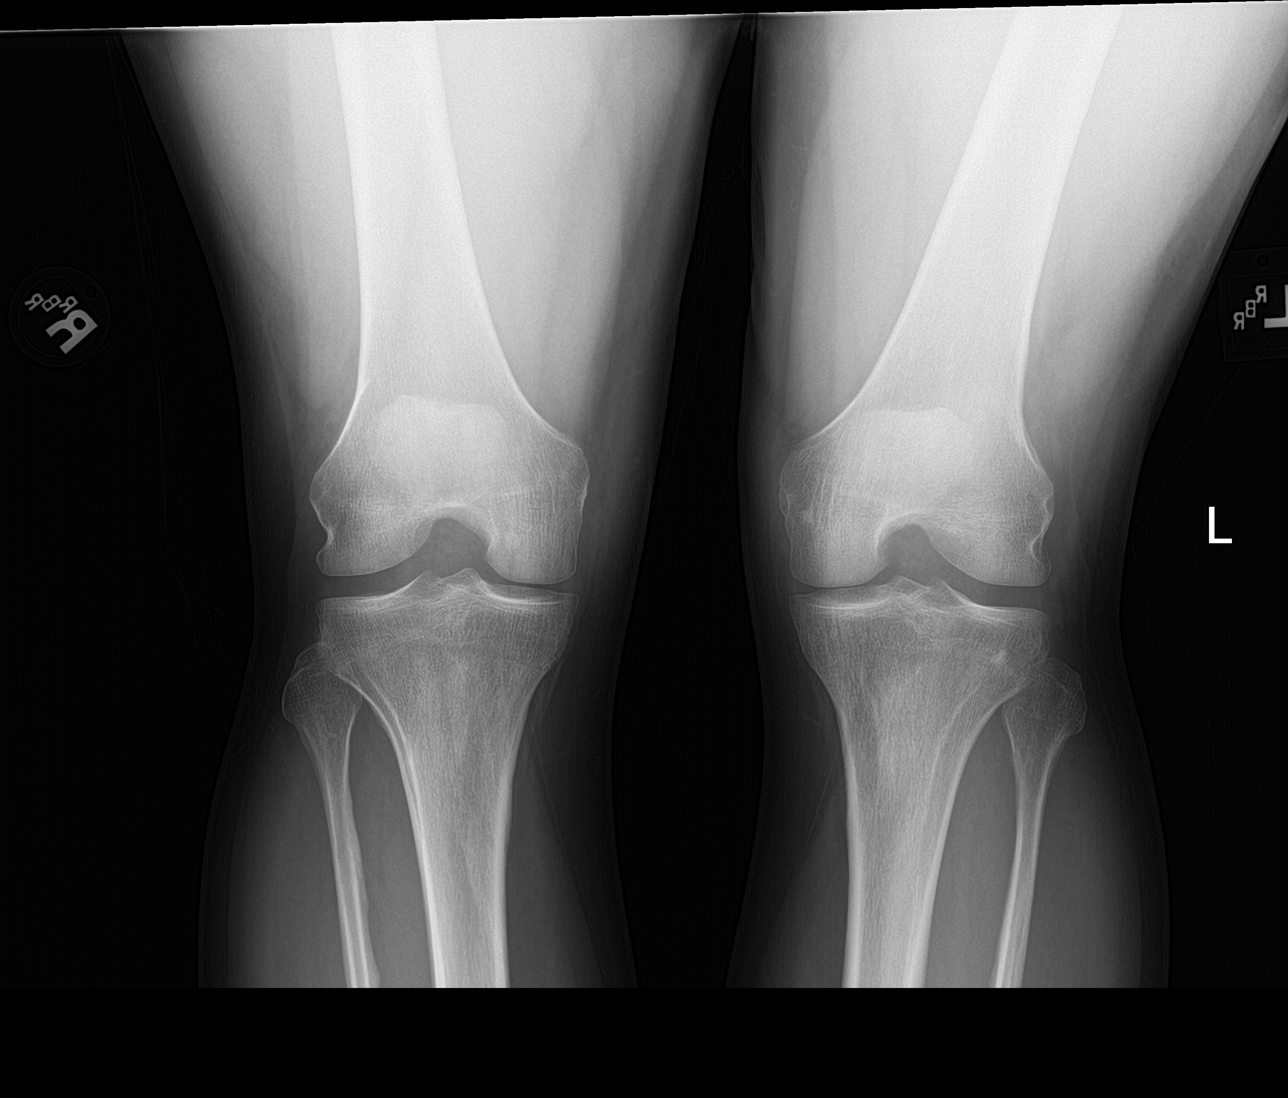

[knee lat]
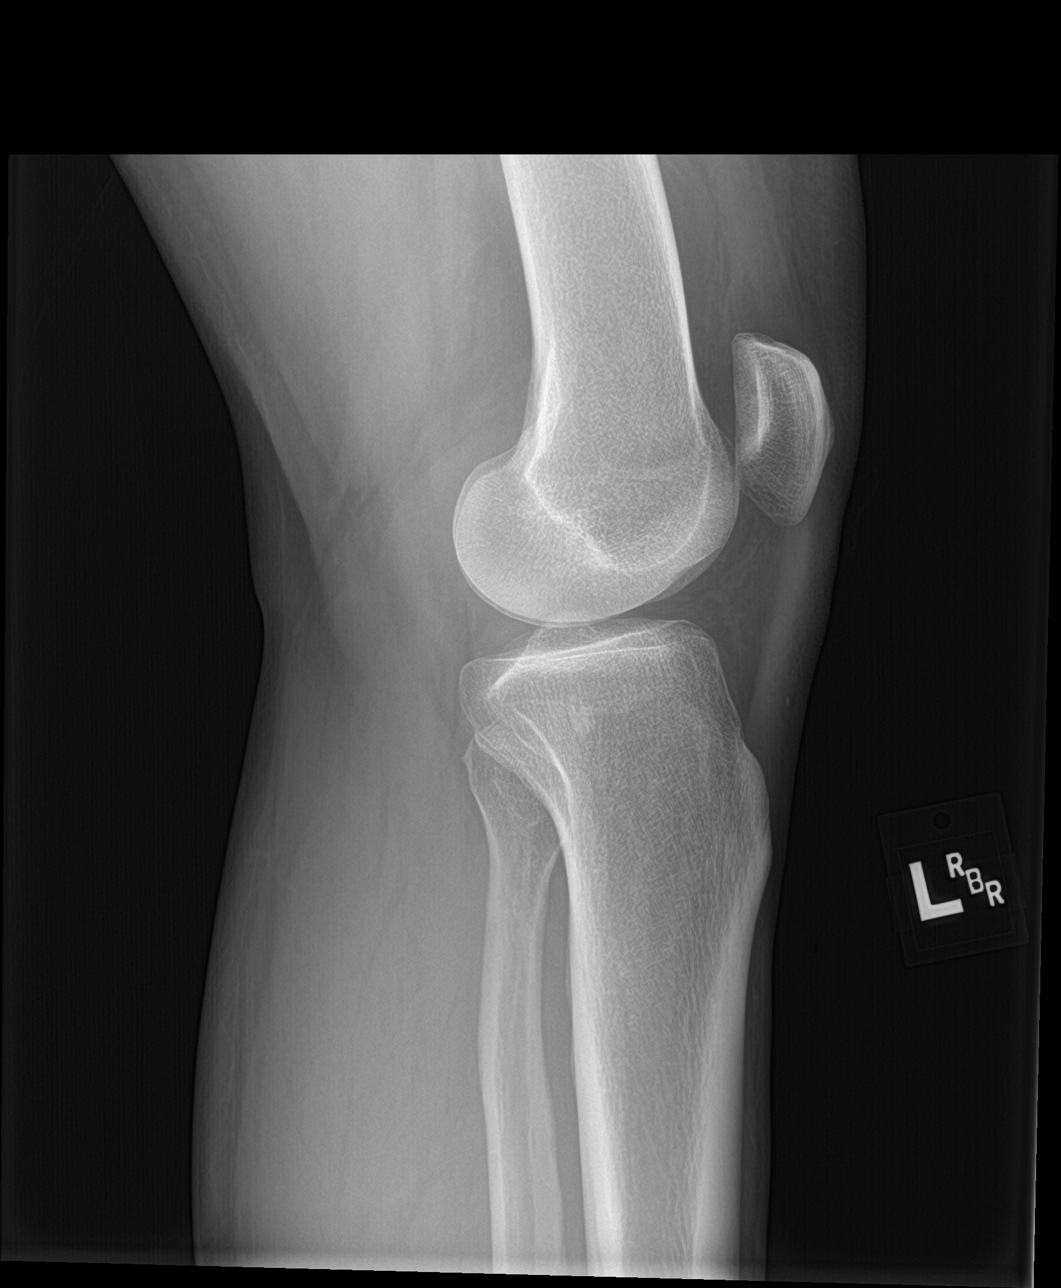

[knee sunrise]
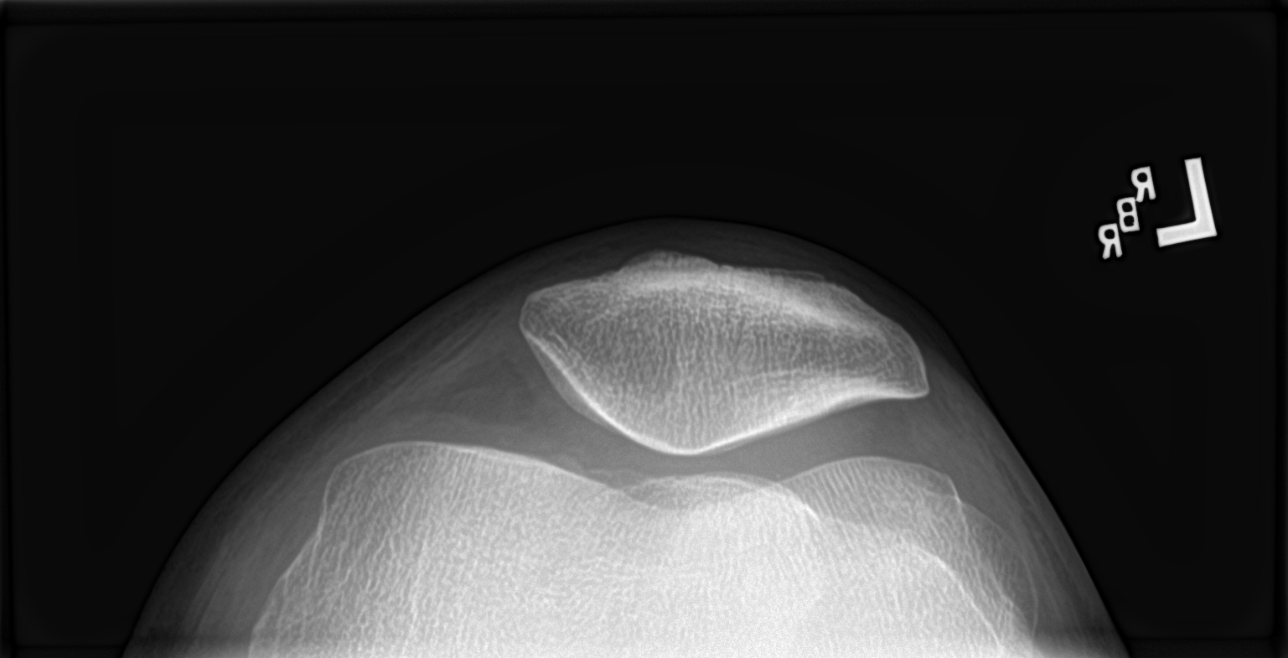

[knee ap bilat standing]
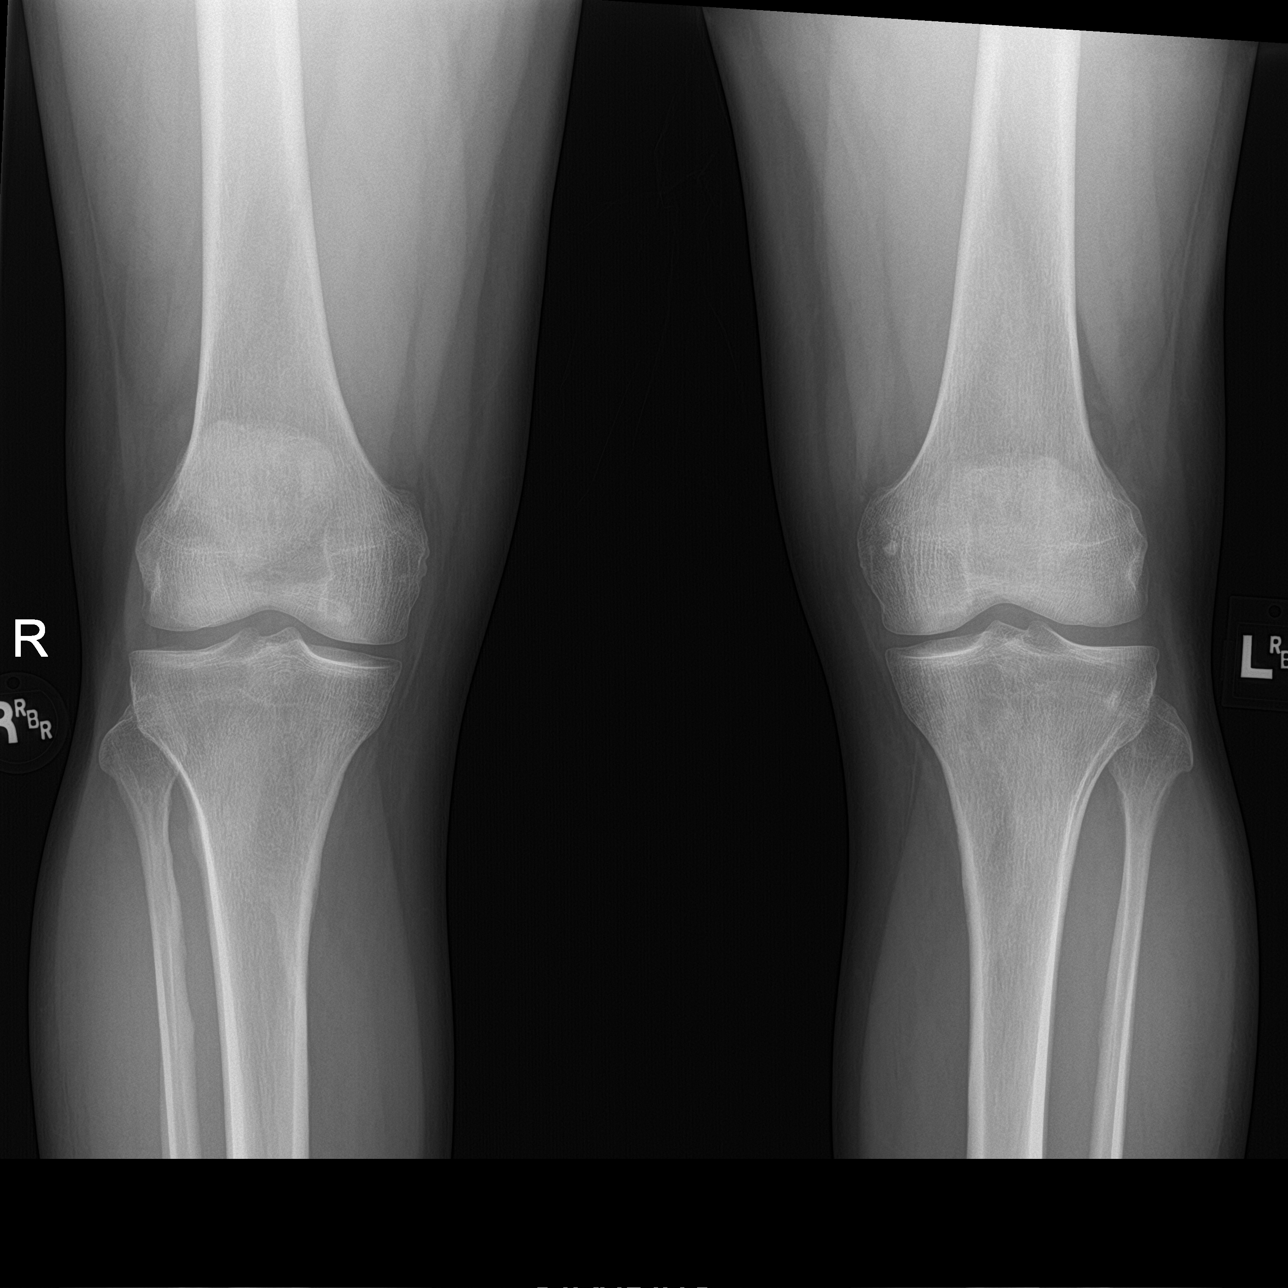

[4 of 4 positions shown; findings below may reference images not displayed]

FINDINGS: No evidence of fracture, malalignment, or joint effusion. No
evidence of arthropathy or other focal bone abnormality. Soft
tissues are unremarkable. Negative AP views of the right knee.
IMPRESSION: Negative knee series.

## 2019-09-19 ENCOUNTER — Ambulatory Visit: Payer: Self-pay | Attending: Internal Medicine

## 2019-09-19 DIAGNOSIS — Z23 Encounter for immunization: Secondary | ICD-10-CM | POA: Insufficient documentation

## 2019-09-19 NOTE — Progress Notes (Signed)
   Covid-19 Vaccination Clinic  Name:  Mitchell Mcbride    MRN: [id number] DOB: 05/08/1971  09/19/2019  Mitchell Mcbride was observed post Covid-19 immunization for 15 minutes without incidence. He was provided with Vaccine Information Sheet and instruction to access the V-Safe system.   Mitchell Mcbride was instructed to call 911 with any severe reactions post vaccine: Marland Kitchen Difficulty breathing  . Swelling of your face and throat  . A fast heartbeat  . A bad rash all over your body  . Dizziness and weakness    Immunizations Administered    Name Date Dose VIS Date Route   Pfizer COVID-19 Vaccine 09/19/2019  6:55 PM 0.3 mL 07/15/2019 Intramuscular   Manufacturer: [employer], [employer]   Lot: [id number]   NDC: [phone]

## 2019-10-11 ENCOUNTER — Ambulatory Visit: Payer: Self-pay | Attending: Internal Medicine

## 2019-10-11 DIAGNOSIS — Z23 Encounter for immunization: Secondary | ICD-10-CM | POA: Insufficient documentation

## 2019-10-11 NOTE — Progress Notes (Signed)
   Covid-19 Vaccination Clinic  Name:  Mitchell Mcbride    MRN: [id number] DOB: [birth year]  10/11/2019  [name] was observed post Covid-19 immunization for 15 minutes without incident. He was provided with Vaccine Information Sheet and instruction to access the V-Safe system.   [name] was instructed to call 911 with any severe reactions post vaccine: Marland Kitchen Difficulty breathing  . Swelling of face and throat  . A fast heartbeat  . A bad rash all over body  . Dizziness and weakness   Immunizations Administered    Name Date Dose VIS Date Route   Pfizer COVID-19 Vaccine 10/11/2019 11:56 AM 0.3 mL 07/15/2019 Intramuscular   Manufacturer: [employer], [employer]   Lot: [id number]   NDC: [address]-8

## 2019-10-12 ENCOUNTER — Ambulatory Visit: Payer: Self-pay

## 2021-05-20 ENCOUNTER — Telehealth: Payer: 59 | Admitting: Physician Assistant

## 2021-05-20 DIAGNOSIS — J019 Acute sinusitis, unspecified: Secondary | ICD-10-CM

## 2021-05-20 DIAGNOSIS — B9689 Other specified bacterial agents as the cause of diseases classified elsewhere: Secondary | ICD-10-CM

## 2021-05-20 MED ORDER — PSEUDOEPH-BROMPHEN-DM 30-2-10 MG/5ML PO SYRP: 5.0000 mL | ORAL_SOLUTION | Freq: Four times a day (QID) | ORAL | 0 refills | Status: DC | PRN

## 2021-05-20 MED ORDER — AMOXICILLIN-POT CLAVULANATE 875-125 MG PO TABS: 1.0000 | ORAL_TABLET | Freq: Two times a day (BID) | ORAL | 0 refills | Status: DC

## 2021-05-20 NOTE — Patient Instructions (Signed)
Mitchell E Brierley, thank you for joining Margaretann Loveless, PA-C for today's virtual visit.  While this provider is not your primary care provider (PCP), if your PCP is located in our provider database this encounter information will be shared with them immediately following your visit.  Consent: (Patient) Mitchell Mcbride provided verbal consent for this virtual visit at the beginning of the encounter.  Current Medications:  Current Outpatient Medications:    amoxicillin-clavulanate (AUGMENTIN) 875-125 MG tablet, Take 1 tablet by mouth 2 (two) times daily., Disp: 14 tablet, Rfl: 0   brompheniramine-pseudoephedrine-DM 30-2-10 MG/5ML syrup, Take 5 mLs by mouth 4 (four) times daily as needed., Disp: 120 mL, Rfl: 0   diclofenac sodium (VOLTAREN) 1 % GEL, Apply 4 g topically 4 (four) times daily. To affected joint., Disp: 100 g, Rfl: 11   ibuprofen (ADVIL,MOTRIN) 200 MG tablet, Take 800 mg by mouth every 6 (six) hours as needed (pain)., Disp: , Rfl:    rosuvastatin (CRESTOR) 20 MG tablet, Take 1 tablet (20 mg total) by mouth daily. (Patient not taking: Reported on 12/22/2016), Disp: 90 tablet, Rfl: 0   Medications ordered in this encounter:  Meds ordered this encounter  Medications   amoxicillin-clavulanate (AUGMENTIN) 875-125 MG tablet    Sig: Take 1 tablet by mouth 2 (two) times daily.    Dispense:  14 tablet    Refill:  0    Order Specific Question:   Supervising Provider    Answer:   Hyacinth Meeker, BRIAN [[id number]]   brompheniramine-pseudoephedrine-DM 30-2-10 MG/5ML syrup    Sig: Take 5 mLs by mouth 4 (four) times daily as needed.    Dispense:  120 mL    Refill:  0    Order Specific Question:   Supervising Provider    Answer:   Hyacinth Meeker, BRIAN [[id number]]     *If you need refills on other medications prior to your next appointment, please contact your pharmacy*  Follow-Up: Call back or seek an in-person evaluation if the symptoms worsen or if the condition fails to improve as anticipated.  Other  Instructions Sinusitis, Adult Sinusitis is soreness and swelling (inflammation) of your sinuses. Sinuses are hollow spaces in the bones around your face. They are located: Around your eyes. In the middle of your forehead. Behind your nose. In your cheekbones. Your sinuses and nasal passages are lined with a fluid called mucus. Mucus drains out of your sinuses. Swelling can trap mucus in your sinuses. This lets germs (bacteria, virus, or fungus) grow, which leads to infection. Most of the time, this condition is caused by a virus. What are the causes? This condition is caused by: Allergies. Asthma. Germs. Things that block your nose or sinuses. Growths in the nose (nasal polyps). Chemicals or irritants in the air. Fungus (rare). What increases the risk? You are more likely to develop this condition if: You have a weak body defense system (immune system). You do a lot of swimming or diving. You use nasal sprays too much. You smoke. What are the signs or symptoms? The main symptoms of this condition are pain and a feeling of pressure around the sinuses. Other symptoms include: Stuffy nose (congestion). Runny nose (drainage). Swelling and warmth in the sinuses. Headache. Toothache. A cough that may get worse at night. Mucus that collects in the throat or the back of the nose (postnasal drip). Being unable to smell and taste. Being very tired (fatigue). A fever. Sore throat. Bad breath. How is this diagnosed? This condition is diagnosed based  on: Your symptoms. Your medical history. A physical exam. Tests to find out if your condition is short-term (acute) or long-term (chronic). Your doctor may: Check your nose for growths (polyps). Check your sinuses using a tool that has a light (endoscope). Check for allergies or germs. Do imaging tests, such as an MRI or CT scan. How is this treated? Treatment for this condition depends on the cause and whether it is short-term or  long-term. If caused by a virus, your symptoms should go away on their own within 10 days. You may be given medicines to relieve symptoms. They include: Medicines that shrink swollen tissue in the nose. Medicines that treat allergies (antihistamines). A spray that treats swelling of the nostrils.  Rinses that help get rid of thick mucus in your nose (nasal saline washes). If caused by bacteria, your doctor may wait to see if you will get better without treatment. You may be given antibiotic medicine if you have: A very bad infection. A weak body defense system. If caused by growths in the nose, you may need to have surgery. Follow these instructions at home: Medicines Take, use, or apply over-the-counter and prescription medicines only as told by your doctor. These may include nasal sprays. If you were prescribed an antibiotic medicine, take it as told by your doctor. Do not stop taking the antibiotic even if you start to feel better. Hydrate and humidify  Drink enough water to keep your pee (urine) pale yellow. Use a cool mist humidifier to keep the humidity level in your home above 50%. Breathe in steam for 10-15 minutes, 3-4 times a day, or as told by your doctor. You can do this in the bathroom while a hot shower is running. Try not to spend time in cool or dry air. Rest Rest as much as you can. Sleep with your head raised (elevated). Make sure you get enough sleep each night. General instructions  Put a warm, moist washcloth on your face 3-4 times a day, or as often as told by your doctor. This will help with discomfort. Wash your hands often with soap and water. If there is no soap and water, use hand sanitizer. Do not smoke. Avoid being around people who are smoking (secondhand smoke). Keep all follow-up visits as told by your doctor. This is important. Contact a doctor if: You have a fever. Your symptoms get worse. Your symptoms do not get better within 10 days. Get help  right away if: You have a very bad headache. You cannot stop throwing up (vomiting). You have very bad pain or swelling around your face or eyes. You have trouble seeing. You feel confused. Your neck is stiff. You have trouble breathing. Summary Sinusitis is swelling of your sinuses. Sinuses are hollow spaces in the bones around your face. This condition is caused by tissues in your nose that become inflamed or swollen. This traps germs. These can lead to infection. If you were prescribed an antibiotic medicine, take it as told by your doctor. Do not stop taking it even if you start to feel better. Keep all follow-up visits as told by your doctor. This is important. This information is not intended to replace advice given to you by your health care provider. Make sure you discuss any questions you have with your health care provider. Document Revised: 12/21/2017 Document Reviewed: 12/21/2017 Elsevier Patient Education  2022 [employer].    If you have been instructed to have an in-person evaluation today at a local  Urgent Care facility, please use the link below. It will take you to a list of all of our available [facility], including address, phone number and hours of operation. Please do not delay care.  [facility]  If you or a family member do not have a primary care provider, use the link below to schedule a visit and establish care. When you choose a [facility] primary care physician or advanced practice provider, you gain a long-term partner in health. Find a Primary Care Provider  Learn more about [facility]'s in-office and virtual care options: [facility]

## 2021-05-20 NOTE — Progress Notes (Signed)
Virtual Visit Consent   Mitchell E Druckenmiller, you are scheduled for a virtual visit with a [facility] provider today.     Just as with appointments in the office, your consent must be obtained to participate.  Your consent will be active for this visit and any virtual visit you may have with one of our providers in the next 365 days.     If you have a MyChart account, a copy of this consent can be sent to you electronically.  All virtual visits are billed to your insurance company just like a traditional visit in the office.    As this is a virtual visit, video technology does not allow for your provider to perform a traditional examination.  This may limit your provider's ability to fully assess your condition.  If your provider identifies any concerns that need to be evaluated in person or the need to arrange testing (such as labs, EKG, etc.), we will make arrangements to do so.     Although advances in technology are sophisticated, we cannot ensure that it will always work on either your end or our end.  If the connection with a video visit is poor, the visit may have to be switched to a telephone visit.  With either a video or telephone visit, we are not always able to ensure that we have a secure connection.     I need to obtain your verbal consent now.   Are you willing to proceed with your visit today?    Mitchell E Cregan has provided verbal consent on 05/20/2021 for a virtual visit (video or telephone).   Margaretann Loveless, PA-C   Date: 05/20/2021 5:33 PM   Virtual Visit via Video Note   I, Margaretann Loveless, connected with  Mitchell Mcbride  ([id number], [birth year]) on 05/20/21 at  5:30 PM EDT by a video-enabled telemedicine application and verified that I am speaking with the correct person using two identifiers.  Location: Patient: Virtual Visit Location Patient: Home Provider: Virtual Visit Location Provider: Home Office   I discussed the limitations of evaluation and  management by telemedicine and the availability of in person appointments. The patient expressed understanding and agreed to proceed.    History of Present Illness: Mitchell E Ron is a 50 y.o. who identifies as a male who was assigned male at birth, and is being seen today for possible sinus infection.  HPI: Sinusitis This is a new problem. The current episode started 1 to 4 weeks ago (started Saturday a week ago). The problem has been gradually worsening since onset. The maximum temperature recorded prior to his arrival was 100.4 - 100.9 F (100.7). The fever has been present for 3 to 4 days. Associated symptoms include chills, coughing, sinus pressure and a sore throat. Pertinent negatives include no ear pain or headaches. (Rhinorrhea, post nasal drainage, myalgia with fevers) Treatments tried: dayquil, sudafed, ibuprofen. The treatment provided mild relief.   Covid testing is negative.  Problems:  Patient Active Problem List   Diagnosis Date Noted   HLD (hyperlipidemia) 05/22/2016   Borderline hypertension 04/21/2016   Humerus distal fracture 03/07/2014   SINUS BRADYCARDIA 03/30/2009   UNSPECIFIED SLEEP DISTURBANCE 03/29/2009   CHEST PAIN, HX OF 03/29/2009   HEARTBURN, HX OF 03/29/2009   ALLERGY, HX OF 03/29/2009    Allergies: No Known Allergies Medications:  Current Outpatient Medications:    amoxicillin-clavulanate (AUGMENTIN) 875-125 MG tablet, Take 1 tablet by mouth 2 (two) times daily., Disp: 14  tablet, Rfl: 0   brompheniramine-pseudoephedrine-DM 30-2-10 MG/5ML syrup, Take 5 mLs by mouth 4 (four) times daily as needed., Disp: 120 mL, Rfl: 0   diclofenac sodium (VOLTAREN) 1 % GEL, Apply 4 g topically 4 (four) times daily. To affected joint., Disp: 100 g, Rfl: 11   ibuprofen (ADVIL,MOTRIN) 200 MG tablet, Take 800 mg by mouth every 6 (six) hours as needed (pain)., Disp: , Rfl:    rosuvastatin (CRESTOR) 20 MG tablet, Take 1 tablet (20 mg total) by mouth daily. (Patient not taking:  Reported on 12/22/2016), Disp: 90 tablet, Rfl: 0  Observations/Objective: Patient is well-developed, well-nourished in no acute distress.  Resting comfortably at home.  Head is normocephalic, atraumatic.  No labored breathing.  Speech is clear and coherent with logical content.  Patient is alert and oriented at baseline.    Assessment and Plan: 1. Acute bacterial sinusitis - amoxicillin-clavulanate (AUGMENTIN) 875-125 MG tablet; Take 1 tablet by mouth 2 (two) times daily.  Dispense: 14 tablet; Refill: 0 - brompheniramine-pseudoephedrine-DM 30-2-10 MG/5ML syrup; Take 5 mLs by mouth 4 (four) times daily as needed.  Dispense: 120 mL; Refill: 0  - Worsening symptoms that have not responded to OTC medications.  - Will give augmentin as below.  - Bromfed DM for cough and congestion - Continue allergy medications.  - Stay well hydrated and get plenty of rest.  - Call or seek in person evaluation if no symptom improvement or if symptoms worsen.  Follow Up Instructions: I discussed the assessment and treatment plan with the patient. The patient was provided an opportunity to ask questions and all were answered. The patient agreed with the plan and demonstrated an understanding of the instructions.  A copy of instructions were sent to the patient via MyChart unless otherwise noted below.    The patient was advised to call back or seek an in-person evaluation if the symptoms worsen or if the condition fails to improve as anticipated.  Time:  I spent 12 minutes with the patient via telehealth technology discussing the above problems/concerns.    Margaretann Loveless, PA-C

## 2023-03-02 ENCOUNTER — Ambulatory Visit (HOSPITAL_BASED_OUTPATIENT_CLINIC_OR_DEPARTMENT_OTHER): Payer: 59

## 2023-03-02 ENCOUNTER — Other Ambulatory Visit: Payer: Self-pay | Admitting: Cardiology

## 2023-03-02 ENCOUNTER — Telehealth: Payer: Self-pay | Admitting: *Deleted

## 2023-03-02 DIAGNOSIS — R079 Chest pain, unspecified: Secondary | ICD-10-CM

## 2023-03-02 DIAGNOSIS — I214 Non-ST elevation (NSTEMI) myocardial infarction: Secondary | ICD-10-CM | POA: Diagnosis not present

## 2023-03-02 LAB — ECHOCARDIOGRAM COMPLETE
Area-P 1/2: 3.3 cm2
S' Lateral: 3.4 cm

## 2023-03-02 MED ORDER — PERFLUTREN LIPID MICROSPHERE
1.0000 mL | INTRAVENOUS | Status: AC | PRN
  Administered 2023-03-02: 3 mL via INTRAVENOUS

## 2023-03-02 NOTE — Telephone Encounter (Signed)
New patient referral sent on this pt.  Dr. Shari Prows spoke with Dr. Royann Shivers about this pt and he will see him as new pt on his DOD schedule for this Friday 8/2 at 0830, pt aware to arrive 15 mins prior to this appt.   STAT echo ordered by Dr. Shari Prows.   Appt scheduled for the pt to see Dr. Royann Shivers on this Friday 8/2 at 0830 as a new pt.  Pt was established with Dr. Graciela Husbands back in 2019, but has not seen him since.   Pt and wife agreed with this plan.

## 2023-03-02 NOTE — Telephone Encounter (Signed)
-----   Message from Meriam Sprague sent at 03/02/2023 10:04 AM EDT ----- Thank you so much for being willing to see him! He is the husband of Sabrina in [place] and has family history of early CAD and is having pretty extensive exertional symptoms. I have added a stat echo in case we can get it done before his appointment. She will take him to the ER if symptoms progress. He is not willing to go in now.  THANK YOU!!  Herbert Seta

## 2023-03-03 ENCOUNTER — Other Ambulatory Visit: Payer: Self-pay

## 2023-03-03 ENCOUNTER — Encounter (HOSPITAL_COMMUNITY): Payer: Self-pay

## 2023-03-03 ENCOUNTER — Emergency Department (HOSPITAL_COMMUNITY): Payer: 59

## 2023-03-03 ENCOUNTER — Inpatient Hospital Stay (HOSPITAL_COMMUNITY)
Admission: EM | Admit: 2023-03-03 | Discharge: 2023-03-05 | DRG: 322 | Disposition: A | Discharge status: Home / Self Care | Payer: 59 | Attending: Internal Medicine | Admitting: Internal Medicine

## 2023-03-03 DIAGNOSIS — E785 Hyperlipidemia, unspecified: Secondary | ICD-10-CM | POA: Diagnosis present

## 2023-03-03 DIAGNOSIS — I959 Hypotension, unspecified: Secondary | ICD-10-CM | POA: Diagnosis not present

## 2023-03-03 DIAGNOSIS — I2510 Atherosclerotic heart disease of native coronary artery without angina pectoris: Secondary | ICD-10-CM | POA: Diagnosis present

## 2023-03-03 DIAGNOSIS — I214 Non-ST elevation (NSTEMI) myocardial infarction: Secondary | ICD-10-CM | POA: Diagnosis not present

## 2023-03-03 DIAGNOSIS — Z8249 Family history of ischemic heart disease and other diseases of the circulatory system: Secondary | ICD-10-CM

## 2023-03-03 DIAGNOSIS — T463X5A Adverse effect of coronary vasodilators, initial encounter: Secondary | ICD-10-CM | POA: Diagnosis present

## 2023-03-03 DIAGNOSIS — Z87891 Personal history of nicotine dependence: Secondary | ICD-10-CM

## 2023-03-03 DIAGNOSIS — Z7982 Long term (current) use of aspirin: Secondary | ICD-10-CM

## 2023-03-03 DIAGNOSIS — Z79899 Other long term (current) drug therapy: Secondary | ICD-10-CM

## 2023-03-03 DIAGNOSIS — G4440 Drug-induced headache, not elsewhere classified, not intractable: Secondary | ICD-10-CM | POA: Diagnosis present

## 2023-03-03 DIAGNOSIS — Z955 Presence of coronary angioplasty implant and graft: Secondary | ICD-10-CM

## 2023-03-03 DIAGNOSIS — I10 Essential (primary) hypertension: Secondary | ICD-10-CM | POA: Diagnosis present

## 2023-03-03 LAB — BASIC METABOLIC PANEL
Anion gap: 11 (ref 5–15)
BUN: 10 mg/dL (ref 6–20)
CO2: 22 mmol/L (ref 22–32)
Calcium: 9.1 mg/dL (ref 8.9–10.3)
Chloride: 102 mmol/L (ref 98–111)
Creatinine, Ser: 1.09 mg/dL (ref 0.61–1.24)
GFR, Estimated: >60 mL/min (ref >60)
Glucose, Bld: 113 mg/dL — ABNORMAL HIGH (ref 70–99)
Potassium: 4 mmol/L (ref 3.5–5.1)
Sodium: 135 mmol/L (ref 135–145)

## 2023-03-03 LAB — CBC
HCT: 43.7 % (ref 39.0–52.0)
Hemoglobin: 15.2 g/dL (ref 13.0–17.0)
MCH: 30.3 pg (ref 26.0–34.0)
MCHC: 34.8 g/dL (ref 30.0–36.0)
MCV: 87.1 fL (ref 80.0–100.0)
Platelets: 309 10*3/uL (ref 150–400)
RBC: 5.02 MIL/uL (ref 4.22–5.81)
RDW: 12.2 % (ref 11.5–15.5)
WBC: 9.6 10*3/uL (ref 4.0–10.5)
nRBC: 0 % (ref 0.0–0.2)

## 2023-03-03 LAB — TROPONIN I (HIGH SENSITIVITY)
Troponin I (High Sensitivity): 221 ng/L (ref <18)
Troponin I (High Sensitivity): 285 ng/L (ref <18)

## 2023-03-03 LAB — HEPARIN LEVEL (UNFRACTIONATED): Heparin Unfractionated: 0.36 IU/mL (ref 0.30–0.70)

## 2023-03-03 LAB — D-DIMER, QUANTITATIVE: D-Dimer, Quant: <0.27 ug/mL-FEU (ref 0.00–0.50)

## 2023-03-03 LAB — MRSA NEXT GEN BY PCR, NASAL: MRSA by PCR Next Gen: NOT DETECTED

## 2023-03-03 MED ORDER — ASPIRIN 81 MG PO CHEW
324.0000 mg | CHEWABLE_TABLET | Freq: Once | ORAL | Status: AC
  Administered 2023-03-03: 324 mg via ORAL
  Filled 2023-03-03: qty 4

## 2023-03-03 MED ORDER — ASPIRIN 81 MG PO CHEW
81.0000 mg | CHEWABLE_TABLET | ORAL | Status: AC
  Administered 2023-03-04: 81 mg via ORAL
  Filled 2023-03-03: qty 1

## 2023-03-03 MED ORDER — NITROGLYCERIN IN D5W 200-5 MCG/ML-% IV SOLN
0.0000 ug/min | INTRAVENOUS | Status: DC
  Administered 2023-03-03 – 2023-03-04 (×2): 5 ug/min via INTRAVENOUS
  Filled 2023-03-03: qty 250

## 2023-03-03 MED ORDER — METOPROLOL TARTRATE 25 MG PO TABS
25.0000 mg | ORAL_TABLET | Freq: Two times a day (BID) | ORAL | Status: DC
  Administered 2023-03-03 – 2023-03-05 (×4): 25 mg via ORAL
  Filled 2023-03-03 (×4): qty 1

## 2023-03-03 MED ORDER — SODIUM CHLORIDE 0.9 % WEIGHT BASED INFUSION
3.0000 mL/kg/h | INTRAVENOUS | Status: DC
  Administered 2023-03-04: 3 mL/kg/h via INTRAVENOUS

## 2023-03-03 MED ORDER — HEPARIN BOLUS VIA INFUSION
4000.0000 [IU] | Freq: Once | INTRAVENOUS | Status: AC
  Administered 2023-03-03: 4000 [IU] via INTRAVENOUS
  Filled 2023-03-03: qty 4000

## 2023-03-03 MED ORDER — SODIUM CHLORIDE 0.9 % WEIGHT BASED INFUSION
1.0000 mL/kg/h | INTRAVENOUS | Status: DC
  Administered 2023-03-04: 1 mL/kg/h via INTRAVENOUS

## 2023-03-03 MED ORDER — CHLORHEXIDINE GLUCONATE CLOTH 2 % EX PADS: 6.0000 | MEDICATED_PAD | Freq: Once | CUTANEOUS | Status: DC

## 2023-03-03 MED ORDER — ACETAMINOPHEN 325 MG PO TABS
650.0000 mg | ORAL_TABLET | ORAL | Status: DC | PRN
  Administered 2023-03-03 – 2023-03-04 (×3): 650 mg via ORAL
  Filled 2023-03-03 (×3): qty 2

## 2023-03-03 MED ORDER — ATORVASTATIN CALCIUM 80 MG PO TABS
80.0000 mg | ORAL_TABLET | Freq: Every day | ORAL | Status: DC
  Administered 2023-03-03 – 2023-03-05 (×3): 80 mg via ORAL
  Filled 2023-03-03: qty 1
  Filled 2023-03-03: qty 2
  Filled 2023-03-03: qty 1

## 2023-03-03 MED ORDER — ASPIRIN 81 MG PO CHEW: 162.0000 mg | CHEWABLE_TABLET | Freq: Once | ORAL | Status: DC

## 2023-03-03 MED ORDER — AMLODIPINE BESYLATE 5 MG PO TABS
5.0000 mg | ORAL_TABLET | Freq: Every day | ORAL | Status: DC
  Administered 2023-03-03 – 2023-03-05 (×3): 5 mg via ORAL
  Filled 2023-03-03 (×3): qty 1

## 2023-03-03 MED ORDER — ONDANSETRON HCL 4 MG/2ML IJ SOLN: 4.0000 mg | Freq: Four times a day (QID) | INTRAMUSCULAR | Status: DC | PRN

## 2023-03-03 MED ORDER — HEPARIN (PORCINE) 25000 UT/250ML-% IV SOLN
1150.0000 [IU]/h | INTRAVENOUS | Status: DC
  Administered 2023-03-03 – 2023-03-04 (×2): 1150 [IU]/h via INTRAVENOUS
  Filled 2023-03-03 (×2): qty 250

## 2023-03-03 MED ORDER — NITROGLYCERIN 0.4 MG SL SUBL
0.4000 mg | SUBLINGUAL_TABLET | SUBLINGUAL | Status: DC | PRN
  Administered 2023-03-04: 0.4 mg via SUBLINGUAL
  Filled 2023-03-03: qty 1

## 2023-03-03 NOTE — ED Triage Notes (Signed)
Pt reports intermittent chest pain for the past 2 weeks that radiates to his left shoulder and arm with shortness of breath.

## 2023-03-03 NOTE — ED Provider Triage Note (Signed)
Emergency Medicine Provider Triage Evaluation Note  Mitchell Mcbride , a 52 y.o. male  was evaluated in triage.  Pt complains of chest pain.  Review of Systems  Positive:  Negative:  Physical Exam  BP (!) 178/129   Pulse (!) 108   Temp 98.3 F (36.8 C) (Oral)   Resp 17   Ht 5\' 11"  (1.803 m)   Wt 94.3 kg   SpO2 99%   BMI 29.01 kg/m  Gen:   Awake, no distress   Resp:  Normal effort  MSK:   Moves extremities without difficulty  Other:    Medical Decision Making  Medically screening exam initiated at 12:30 PM.  Appropriate orders placed.  Mitchell Mcbride was informed that the remainder of the evaluation will be completed by another provider, this initial triage assessment does not replace that evaluation, and the importance of remaining in the ED until their evaluation is complete.  Concerns for intermittent left sided chest pain and SOB with exertion x2 weeks that radiates down left arm. Also complaining of palpitations, mild nausea, and mild sweating. Pain and SOB usually happens with exertion, but has also woken patient from sleep a couple of times.   Denies fever, cough, abdominal pain, vomiting.   Echo yesterday but no results from that yet.   Dorthy Cooler, [place] 03/03/23 1234

## 2023-03-03 NOTE — Progress Notes (Signed)
ANTICOAGULATION CONSULT NOTE - Initial Consult  Pharmacy Consult for Heparin Indication: chest pain/ACS  No Known Allergies  Patient Measurements: Height: 5\' 11"  (180.3 cm) Weight: 94.9 kg (209 lb 3.2 oz) IBW/kg (Calculated) : 75.3 Heparin Dosing Weight: 94 kg  Vital Signs: Temp: 98.4 F (36.9 C) (07/30 1900) Temp Source: Oral (07/30 1900) BP: 115/95 (07/30 1900) Pulse Rate: 96 (07/30 1900)  Labs: Recent Labs    03/03/23 1241 03/03/23 1435 03/03/23 2146  HGB 15.2  --   --   HCT 43.7  --   --   PLT 309  --   --   HEPARINUNFRC  --   --  0.36  CREATININE 1.09  --   --   TROPONINIHS 221* 285*  --     Estimated Creatinine Clearance: 94.2 mL/min (by C-G formula based on SCr of 1.09 mg/dL).   Medical History: Past Medical History:  Diagnosis Date   Hyperlipidemia    Kidney stones    Assessment: 20 YOM presenting with exertional angina found to have elevated troponins concerning for NSTEMI. Not on anticoagulation PTA. Pharmacy consulted to dose heparin.  Heparin level came back therapeutic tonight. We will cont with current rate and check in AM.   Goal of Therapy:  Heparin level 0.3-0.7 units/ml Monitor platelets by anticoagulation protocol: Yes   Plan:  Heparin 1150 units/hr Check AM heparin level Monitor for signs and symptoms of bleeding  Ulyses Southward, PharmD, BCIDP, AAHIVP, CPP Infectious Disease Pharmacist 03/03/2023 10:29 PM

## 2023-03-03 NOTE — ED Provider Notes (Signed)
[place] EMERGENCY DEPARTMENT AT [facility] Provider Note   CSN: [id number] Arrival date & time: 03/03/23  1149     History  Chief Complaint  Patient presents with   Chest Pain    Mitchell Mcbride is a 52 y.o. male.  HPI 52 year old male history of untreated hypertension, family history coronary disease, presents today with symptoms consistent with exertional angina over the past 2 weeks.  He has had pain with any exertion for the past 2 weeks.  Over the past couple nights pain has woken him up.  Pain woke him up from sleep last night and is now radiating to his left shoulder and has some radiation down his left arm.  He has been seen and evaluated in the past with [facility] medical [facility].  He was seen in the office yesterday and had echocardiogram reported as normal.  Due to his increasing and ongoing symptoms he was told to come to the ED for further evaluation is currently having pain in his left shoulder.  He reports that pain has occurred with any exertion.  He has had some associated dyspnea.  He has not had fever chills nausea or vomiting.  He has had no risk factors for PE, no leg swelling or pain    Home Medications Prior to Admission medications   Medication Sig Start Date End Date Taking? Authorizing Provider  aspirin EC 81 MG tablet Take 81 mg by mouth daily. Swallow whole.   Yes [provider]  ibuprofen (ADVIL) 200 MG tablet Take 800 mg by mouth as needed for moderate pain or mild pain.   Yes [provider]      Allergies    Patient has no known allergies.    Review of Systems   Review of Systems  Physical Exam Updated Vital Signs BP (!) 138/101   Pulse 86   Temp 97.8 F (36.6 C) (Oral)   Resp 11   Ht 1.803 m (5\' 11" )   Wt 94.3 kg   SpO2 99%   BMI 29.01 kg/m  Physical Exam Vitals reviewed.  HENT:     Head: Normocephalic.  Eyes:     Pupils: Pupils are equal, round, and reactive to light.  Cardiovascular:     Rate and  Rhythm: Normal rate and regular rhythm.     Heart sounds: Normal heart sounds.  Pulmonary:     Effort: Pulmonary effort is normal.     Breath sounds: Normal breath sounds.  Abdominal:     General: Bowel sounds are normal.     Palpations: Abdomen is soft.  Musculoskeletal:        General: Normal range of motion.     Cervical back: Normal range of motion.  Skin:    General: Skin is warm.     Capillary Refill: Capillary refill takes less than 2 seconds.  Neurological:     General: No focal deficit present.     Mental Status: He is alert.     ED Results / Procedures / Treatments   Labs (all labs ordered are listed, but only abnormal results are displayed) Labs Reviewed  BASIC METABOLIC PANEL - Abnormal; Notable for the following components:      Result Value   Glucose, Bld 113 (*)    All other components within normal limits  TROPONIN I (HIGH SENSITIVITY) - Abnormal; Notable for the following components:   Troponin I (High Sensitivity) 221 (*)    All other components within normal limits  TROPONIN I (HIGH SENSITIVITY) - Abnormal; Notable for the following components:   Troponin I (High Sensitivity) 285 (*)    All other components within normal limits  CBC  D-DIMER, QUANTITATIVE  LIPOPROTEIN A (LPA)  BASIC METABOLIC PANEL  LIPID PANEL  CBC  HEPARIN LEVEL (UNFRACTIONATED)    EKG None ED ECG REPORT   Date: 03/03/2023  Rate: 111  Rhythm: sinus tachycardia  QRS Axis: normal  Intervals: normal  ST/T Wave abnormalities: normal  Conduction Disutrbances:none  Narrative Interpretation:   Old EKG Reviewed: none available  I have personally reviewed the EKG tracing and agree with the computerized printout as noted.   Radiology DG Chest 2 View  Result Date: 03/03/2023 CLINICAL DATA:  chest pain EXAM: CHEST - 2 VIEW COMPARISON:  None Available. FINDINGS: The heart size and mediastinal contours are within normal limits. Both lungs are clear. The visualized skeletal  structures are unremarkable. IMPRESSION: No active cardiopulmonary disease. Electronically Signed   By: Judie Petit.  Shick M.D.   On: 03/03/2023 12:59   ECHOCARDIOGRAM COMPLETE  Result Date: 03/02/2023    ECHOCARDIOGRAM REPORT   Patient Name:   Mitchell E Castelluccio Date of Exam: 03/02/2023 Medical Rec #:  [id number]       Height:       71.0 in Accession #:    [id number]      Weight:       199.0 lb Date of Birth:  1970-08-08       BSA:          2.104 m Patient Age:    51 years        BP:           174/119 mmHg Patient Gender: M               HR:           60 bpm. Exam Location:  [address] Procedure: 2D Echo, Cardiac Doppler, Color Doppler and Intracardiac            Opacification Agent Indications:    Chest Pain R07.9  History:        Patient has prior history of Echocardiogram examinations, most                 recent 12/08/2014. Risk Factors:Dyslipidemia.  Sonographer:    Chanetta Marshall [facility], RDCS Referring Phys: [id number] HEATHER E [place] IMPRESSIONS  1. Left ventricular ejection fraction, by estimation, is 55 to 60%. The left ventricle has normal function. The left ventricle has no regional wall motion abnormalities. Left ventricular diastolic parameters are consistent with Grade I diastolic dysfunction (impaired relaxation).  2. Right ventricular systolic function is normal. The right ventricular size is normal.  3. The mitral valve is normal in structure. Trivial mitral valve regurgitation.  4. The aortic valve is tricuspid. Aortic valve regurgitation is not visualized. No aortic stenosis is present.  5. The inferior vena cava is normal in size with greater than 50% respiratory variability, suggesting right atrial pressure of 3 mmHg. Comparison(s): No significant change from prior study. FINDINGS  Left Ventricle: Left ventricular ejection fraction, by estimation, is 55 to 60%. The left ventricle has normal function. The left ventricle has no regional wall motion abnormalities. Definity contrast agent was given IV to  delineate the left ventricular  endocardial borders. The left ventricular internal cavity size was normal in size. There is no left ventricular hypertrophy. Left ventricular diastolic parameters are consistent with Grade I diastolic dysfunction (impaired relaxation). Right Ventricle: The  right ventricular size is normal. No increase in right ventricular wall thickness. Right ventricular systolic function is normal. Left Atrium: Left atrial size was normal in size. Right Atrium: Right atrial size was normal in size. Pericardium: There is no evidence of pericardial effusion. Mitral Valve: The mitral valve is normal in structure. Trivial mitral valve regurgitation. Tricuspid Valve: The tricuspid valve is normal in structure. Tricuspid valve regurgitation is trivial. Aortic Valve: The aortic valve is tricuspid. Aortic valve regurgitation is not visualized. No aortic stenosis is present. Pulmonic Valve: The pulmonic valve was normal in structure. Pulmonic valve regurgitation is trivial. Aorta: The aortic root and ascending aorta are structurally normal, with no evidence of dilitation. Venous: The inferior vena cava is normal in size with greater than 50% respiratory variability, suggesting right atrial pressure of 3 mmHg. IAS/Shunts: The atrial septum is grossly normal.  LEFT VENTRICLE PLAX 2D LVIDd:         4.90 cm   Diastology LVIDs:         3.40 cm   LV e' medial:    4.79 cm/s LV PW:         1.00 cm   LV E/e' medial:  10.5 LV IVS:        0.90 cm   LV e' lateral:   8.05 cm/s LVOT diam:     2.10 cm   LV E/e' lateral: 6.3 LV SV:         63 LV SV Index:   30 LVOT Area:     3.46 cm  RIGHT VENTRICLE RV Basal diam:  3.00 cm RV Mid diam:    3.10 cm RV S prime:     8.49 cm/s TAPSE (M-mode): 2.5 cm LEFT ATRIUM             Index        RIGHT ATRIUM           Index LA diam:        4.00 cm 1.90 cm/m   RA Area:     12.90 cm LA Vol (A2C):   27.4 ml 13.02 ml/m  RA Volume:   29.20 ml  13.88 ml/m LA Vol (A4C):   22.8 ml 10.84  ml/m LA Biplane Vol: 25.8 ml 12.26 ml/m  AORTIC VALVE LVOT Vmax:   91.95 cm/s LVOT Vmean:  60.350 cm/s LVOT VTI:    0.183 m  AORTA Ao Root diam: 3.70 cm Ao Asc diam:  3.40 cm MITRAL VALVE MV Area (PHT): 3.30 cm    SHUNTS MV Decel Time: 230 msec    Systemic VTI:  0.18 m MV E velocity: 50.40 cm/s  Systemic Diam: 2.10 cm MV A velocity: 62.70 cm/s MV E/A ratio:  0.80 Laurance Flatten MD Electronically signed by Laurance Flatten MD Signature Date/Time: 03/02/2023/2:34:48 PM    Final     Procedures .Critical Care  Performed by: Margarita Grizzle, MD Authorized by: Margarita Grizzle, MD   Critical care provider statement:    Critical care time (minutes):  30   Critical care was time spent personally by me on the following activities:  Development of treatment plan with patient or surrogate, discussions with consultants, evaluation of patient's response to treatment, examination of patient, ordering and review of laboratory studies, ordering and review of radiographic studies, ordering and performing treatments and interventions, pulse oximetry, re-evaluation of patient's condition and review of old charts     Medications Ordered in ED Medications  nitroGLYCERIN 50 mg in dextrose 5 % 250 mL (  0.2 mg/mL) infusion (10 mcg/min Intravenous Rate/Dose Change 03/03/23 1558)  heparin ADULT infusion 100 units/mL (25000 units/254mL) (1,150 Units/hr Intravenous New Bag/Given 03/03/23 1552)  nitroGLYCERIN (NITROSTAT) SL tablet 0.4 mg (has no administration in time range)  acetaminophen (TYLENOL) tablet 650 mg (has no administration in time range)  ondansetron (ZOFRAN) injection 4 mg (has no administration in time range)  metoprolol tartrate (LOPRESSOR) tablet 25 mg (has no administration in time range)  atorvastatin (LIPITOR) tablet 80 mg (80 mg Oral Given 03/03/23 1738)  amLODipine (NORVASC) tablet 5 mg (5 mg Oral Given 03/03/23 1801)  aspirin chewable tablet 324 mg (324 mg Oral Given 03/03/23 1536)  heparin bolus via  infusion 4,000 Units (4,000 Units Intravenous Bolus from Bag 03/03/23 1554)    ED Course/ Medical Decision Making/ A&P                                 Medical Decision Making Amount and/or Complexity of Data Reviewed Labs: ordered.  Risk OTC drugs. Prescription drug management. Decision regarding hospitalization.   52 year old male presents today with left-sided chest pain that has been occurring with exertion.  EKG without acute ST changes.  First troponin is elevated at 220. Differential diagnosis includes but is not limited to acute coronary syndrome, PE, dissection, intrinsic lung disease including infections. Doubt PE based on type of symptoms and low index of suspicion for DVT No acute infectious symptoms Chest x-Rajvir Ernster without widened mediastinum, pneumothorax, or other signs of infection or dissection Based on elevated troponin, highest index of suspicion for acute coronary syndrome. Nitro and heparin and aspirin being given Consult placed to cardiology-discussed with cardiology who will see for admission Discussed PE.  Will add D-dimer       Final Clinical Impression(s) / ED Diagnoses Final diagnoses:  NSTEMI (non-ST elevated myocardial infarction) [facility])    Rx / DC Orders ED Discharge Orders     None         Margarita Grizzle, MD 03/03/23 [phone]

## 2023-03-03 NOTE — Progress Notes (Signed)
ANTICOAGULATION CONSULT NOTE - Initial Consult  Pharmacy Consult for Heparin Indication: chest pain/ACS  No Known Allergies  Patient Measurements: Height: 5\' 11"  (180.3 cm) Weight: 94.3 kg (208 lb) IBW/kg (Calculated) : 75.3 Heparin Dosing Weight: 94 kg  Vital Signs: Temp: 98.3 F (36.8 C) (07/30 1205) Temp Source: Oral (07/30 1205) BP: 178/129 (07/30 1205) Pulse Rate: 108 (07/30 1205)  Labs: Recent Labs    03/03/23 1241  HGB 15.2  HCT 43.7  PLT 309  CREATININE 1.09  TROPONINIHS 221*    Estimated Creatinine Clearance: 94 mL/min (by C-G formula based on SCr of 1.09 mg/dL).   Medical History: Past Medical History:  Diagnosis Date   Hyperlipidemia    Kidney stones    Assessment: 48 YOM presenting with exertional angina found to have elevated troponins concerning for NSTEMI. Not on anticoagulation PTA. Pharmacy consulted to dose heparin.  Goal of Therapy:  Heparin level 0.3-0.7 units/ml Monitor platelets by anticoagulation protocol: Yes   Plan:  Heparin 4000 units IV once then start 1150 units/hr Check 6hr heparin level Monitor for signs and symptoms of bleeding  Eldridge Scot, PharmD Clinical Pharmacist 03/03/2023, 5:16 PM

## 2023-03-03 NOTE — H&P (Signed)
Cardiology Admission History and Physical   Patient ID: Mitchell Mcbride MRN: [id number]; DOB: 1971-07-13   Admission date: 03/03/2023  PCP:  Pcp, No   [facility] Providers Cardiologist:  None   [place]     Chief Complaint:  chest pain  Patient Profile:   Mitchell Mcbride is a 52 y.o. male with chest pain and hypertension who is being seen 03/03/2023 for the evaluation of NSTEMI/elevated troponin.  History of Present Illness:   Mitchell Mcbride is a 52 year old male, his wife Mitchell Mcbride is an exercise technologist in our practice.  Presents with 2 weeks of chest discomfort with significant exertional symptoms, chest pain with minimal activity including showering.  Stat echocardiogram ordered by Dr. Shari Prows yesterday which was normal, however on echo blood pressure was noted to be 170/110.  Patient presents today to the ER with chest pain, EKG with sinus tachycardia and no ST deviations.  First troponin 221.  Strong family history of heart disease, father died suddenly at age 71 of presumed MI.  Both sides of his family have significant coronary artery disease.  He does not routinely follow with a physician and does not know his usual blood pressure readings.  Currently with mild chest pain and currently on nitroglycerin infusion for blood pressure.  Blood pressure while on nitro infusion is still 158/106.  He has been initiated on a heparin infusion for NSTEMI.  Second troponin available at the time of this dictation, 285.  Pertinent laboratory studies include normal sodium and potassium, creatinine 1.09, troponins as above, normal CBC.  Does not appear to be on any antihypertensive or lipid-lowering therapy at home.   Past Medical History:  Diagnosis Date   Hyperlipidemia    Kidney stones     Past Surgical History:  Procedure Laterality Date   CARDIAC CATHETERIZATION     fracture right ankle     ORIF HUMERUS FRACTURE Right 03/07/2014   Procedure: OPEN REDUCTION  INTERNAL FIXATION (ORIF) DISTAL HUMERUS FRACTURE;  Surgeon: Dominica Severin, MD;  Location: [facility] OR;  Service: Orthopedics;  Laterality: Right;   stent for kidney stones       Medications Prior to Admission: Prior to Admission medications   Medication Sig Start Date End Date Taking? Authorizing Provider  amoxicillin-clavulanate (AUGMENTIN) 875-125 MG tablet Take 1 tablet by mouth 2 (two) times daily. 05/20/21   Margaretann Loveless, PA-C  brompheniramine-pseudoephedrine-DM 30-2-10 MG/5ML syrup Take 5 mLs by mouth 4 (four) times daily as needed. 05/20/21   Margaretann Loveless, PA-C  diclofenac sodium (VOLTAREN) 1 % GEL Apply 4 g topically 4 (four) times daily. To affected joint. 12/22/16   Rodolph Bong, MD  ibuprofen (ADVIL,MOTRIN) 200 MG tablet Take 800 mg by mouth every 6 (six) hours as needed (pain).    [provider]  rosuvastatin (CRESTOR) 20 MG tablet Take 1 tablet (20 mg total) by mouth daily. Patient not taking: Reported on 12/22/2016 05/23/16   Rodolph Bong, MD     Allergies:   No Known Allergies  Social History:   Social History   Socioeconomic History   Marital status: Married    Spouse name: Not on file   Number of children: Not on file   Years of education: Not on file   Highest education level: Not on file  Occupational History   Not on file  Tobacco Use   Smoking status: Former    Current packs/day: 0.00    Types: Cigarettes    Quit date:  08/05/1991    Years since quitting: 31.5   Smokeless tobacco: Former    Quit date: 03/08/2011  Substance and Sexual Activity   Alcohol use: Yes    Alcohol/week: 5.0 standard drinks of alcohol    Types: 5 Standard drinks or equivalent per week    Comment: social   Drug use: No   Sexual activity: Not on file  Other Topics Concern   Not on file  Social History Narrative   Not on file   Social Determinants of Health   Financial Resource Strain: Not on file  Food Insecurity: Not on file  Transportation Needs: Not on  file  Physical Activity: Not on file  Stress: Not on file  Social Connections: Not on file  Intimate Partner Violence: Not on file    Family History:   The patient's family history includes Heart attack in his father.    ROS:  Please see the history of present illness.  All other ROS reviewed and negative.     Physical Exam/Data:   Vitals:   03/03/23 1205 03/03/23 1222 03/03/23 1533  BP: (!) 178/129  (!) 178/122  Pulse: (!) 108  75  Resp: 17  13  Temp: 98.3 F (36.8 C)  97.8 F (36.6 C)  TempSrc: Oral  Oral  SpO2: 99%  100%  Weight:  94.3 kg   Height:  5\' 11"  (1.803 m)    No intake or output data in the 24 hours ending 03/03/23 1748    03/03/2023   12:22 PM 12/22/2016   11:19 AM 05/21/2016    8:21 AM  Last 3 Weights  Weight (lbs) 208 lb 199 lb 206 lb  Weight (kg) 94.348 kg 90.266 kg 93.441 kg     Body mass index is 29.01 kg/m.  Constitutional: No acute distress Eyes: sclera non-icteric, normal conjunctiva and lids ENMT: normal dentition, moist mucous membranes Cardiovascular: regular rhythm, normal rate, no murmur. S1 and S2 normal. No jugular venous distention.  Respiratory: clear to auscultation bilaterally GI : normal bowel sounds, soft and nontender. No distention.   MSK: extremities warm, well perfused. No edema.  NEURO: grossly nonfocal exam, moves all extremities. PSYCH: alert and oriented x 3, normal mood and affect.     EKG:  The ECG that was done 03/03/2023 was personally reviewed and demonstrates sinus tachycardia.  Telemetry demonstrates sinus rhythm  Relevant CV Studies: Echocardiogram performed yesterday as an outpatient was grossly normal.  Laboratory Data:  High Sensitivity Troponin:   Recent Labs  Lab 03/03/23 1241 03/03/23 1435  TROPONINIHS 221* 285*      Chemistry Recent Labs  Lab 03/03/23 1241  NA 135  K 4.0  CL 102  CO2 22  GLUCOSE 113*  BUN 10  CREATININE 1.09  CALCIUM 9.1  GFRNONAA >60  ANIONGAP 11    No results  for input(s): "PROT", "ALBUMIN", "AST", "ALT", "ALKPHOS", "BILITOT" in the last 168 hours. Lipids No results for input(s): "CHOL", "TRIG", "HDL", "LABVLDL", "LDLCALC", "CHOLHDL" in the last 168 hours. Hematology Recent Labs  Lab 03/03/23 1241  WBC 9.6  RBC 5.02  HGB 15.2  HCT 43.7  MCV 87.1  MCH 30.3  MCHC 34.8  RDW 12.2  PLT 309   Thyroid No results for input(s): "TSH", "FREET4" in the last 168 hours. BNPNo results for input(s): "BNP", "PROBNP" in the last 168 hours.  DDimer  Recent Labs  Lab 03/03/23 1523  DDIMER <0.27     Radiology/Studies:  DG Chest 2 View  Result  Date: 03/03/2023 CLINICAL DATA:  chest pain EXAM: CHEST - 2 VIEW COMPARISON:  None Available. FINDINGS: The heart size and mediastinal contours are within normal limits. Both lungs are clear. The visualized skeletal structures are unremarkable. IMPRESSION: No active cardiopulmonary disease. Electronically Signed   By: Judie Petit.  Shick M.D.   On: 03/03/2023 12:59     Assessment and Plan:   Principal Problem:   NSTEMI (non-ST elevated myocardial infarction) [facility])  -Patient presents with substernal chest pain radiating to the left shoulder and left arm with positive troponins, consistent with NSTEMI.  Patient also noted to have significantly elevated blood pressure at least 170/110, blood pressure readings appear more accurate from the left arm, previous surgery on the right arm.  We reviewed that if only nonobstructive CAD is found, troponin elevation likely secondary to significantly elevated blood pressure.  Strong family history of coronary artery disease and likely untreated hypertension.  Discussed cardiac catheterization and obtain consent with patient's wife and patient's mother in the room. -Nitroglycerin infusion for chest pain and management of hypertension, will also initiate metoprolol tartrate 25 mg twice daily. -Initiate amlodipine 5 mg daily -Start atorvastatin 80 mg daily, repeat lipid panel in  AM.  INFORMED CONSENT: I have reviewed the risks, indications, and alternatives to cardiac catheterization, possible angioplasty, and stenting with the patient. Risks include but are not limited to bleeding, infection, vascular injury, stroke, myocardial infarction, arrhythmia, kidney injury, radiation-related injury in the case of prolonged fluoroscopy use, emergency cardiac surgery, and death. The patient understands the risks of serious complication is 1-2 in 1000 with diagnostic cardiac cath and 1-2% or less with angioplasty/stenting.    Risk Assessment/Risk Scores:    TIMI Risk Score for Unstable Angina or Non-ST Elevation MI:   The patient's TIMI risk score is 2, which indicates a 8% risk of all cause mortality, new or recurrent myocardial infarction or need for urgent revascularization in the next 14 days.      Code Status: Full Code  Severity of Illness: The appropriate patient status for this patient is INPATIENT. Inpatient status is judged to be reasonable and necessary in order to provide the required intensity of service to ensure the patient's safety. The patient's presenting symptoms, physical exam findings, and initial radiographic and laboratory data in the context of their chronic comorbidities is felt to place them at high risk for further clinical deterioration. Furthermore, it is not anticipated that the patient will be medically stable for discharge from the hospital within 2 midnights of admission.   * I certify that at the point of admission it is my clinical judgment that the patient will require inpatient hospital care spanning beyond 2 midnights from the point of admission due to high intensity of service, high risk for further deterioration and high frequency of surveillance required.*   For questions or updates, please contact [facility] Please consult [email or web address] for contact info under     Signed, Parke Poisson, MD  03/03/2023 5:48 PM

## 2023-03-03 NOTE — ED Notes (Signed)
ED TO INPATIENT HANDOFF REPORT  ED Nurse Name and Phone #: [phone]  S Name/Age/Gender Mitchell Mcbride 52 y.o. male Room/Bed: 005C/005C  Code Status   Code Status: Full Code  Home/SNF/Other Home Patient oriented to: self, place, time, and situation Is this baseline? Yes   Triage Complete: Triage complete  Chief Complaint NSTEMI (non-ST elevated myocardial infarction) [facility]) [I21.4]  Triage Note Pt reports intermittent chest pain for the past 2 weeks that radiates to his left shoulder and arm with shortness of breath.    Allergies No Known Allergies  Level of Care/Admitting Diagnosis ED Disposition     ED Disposition  Admit   Condition  --   Comment  Hospital Area: [facility] [[id number]]  Level of Care: Telemetry Cardiac [[id number]]  May place patient in observation at [facility] or [facility] if equivalent level of care is available:: No  Covid Evaluation: Asymptomatic - no recent exposure (last 10 days) testing not required  Diagnosis: NSTEMI (non-ST elevated myocardial infarction) [facility]) [[id number]]  Admitting Physician: Parke Poisson [[id number]]  Attending Physician: Parke Poisson [[id number]]          B Medical/Surgery History Past Medical History:  Diagnosis Date   Hyperlipidemia    Kidney stones    Past Surgical History:  Procedure Laterality Date   CARDIAC CATHETERIZATION     fracture right ankle     ORIF HUMERUS FRACTURE Right 03/07/2014   Procedure: OPEN REDUCTION INTERNAL FIXATION (ORIF) DISTAL HUMERUS FRACTURE;  Surgeon: Dominica Severin, MD;  Location: [facility] OR;  Service: Orthopedics;  Laterality: Right;   stent for kidney stones       A IV Location/Drains/Wounds Patient Lines/Drains/Airways Status     Active Line/Drains/Airways     Name Placement date Placement time Site Days   Peripheral IV 03/03/23 20 G Right Antecubital 03/03/23  1527  Antecubital  less than 1   Peripheral IV 03/03/23 20 G Left Antecubital 03/03/23  1528   Antecubital  less than 1   Incision (Closed) 03/07/14 Arm Right 03/07/14  1802  -- 3283            Intake/Output Last 24 hours No intake or output data in the 24 hours ending 03/03/23 1745  Labs/Imaging Results for orders placed or performed during the hospital encounter of 03/03/23 (from the past 48 hour(s))  Basic metabolic panel     Status: Abnormal   Collection Time: 03/03/23 12:41 PM  Result Value Ref Range   Sodium 135 135 - 145 mmol/L   Potassium 4.0 3.5 - 5.1 mmol/L   Chloride 102 98 - 111 mmol/L   CO2 22 22 - 32 mmol/L   Glucose, Bld 113 (H) 70 - 99 mg/dL    Comment: Glucose reference range applies only to samples taken after fasting for at least 8 hours.   BUN 10 6 - 20 mg/dL   Creatinine, Ser 2.44 0.61 - 1.24 mg/dL   Calcium 9.1 8.9 - 01.0 mg/dL   GFR, Estimated >27 >25 mL/min    Comment: (NOTE) Calculated using the CKD-EPI Creatinine Equation (2021)    Anion gap 11 5 - 15    Comment: Performed at [facility], [address] [place], [place] [address]  CBC     Status: None   Collection Time: 03/03/23 12:41 PM  Result Value Ref Range   WBC 9.6 4.0 - 10.5 K/uL   RBC 5.02 4.22 - 5.81 MIL/uL   Hemoglobin 15.2 13.0 - 17.0  g/dL   HCT 66.4 40.3 - 47.4 %   MCV 87.1 80.0 - 100.0 fL   MCH 30.3 26.0 - 34.0 pg   MCHC 34.8 30.0 - 36.0 g/dL   RDW 25.9 56.3 - 87.5 %   Platelets 309 150 - 400 K/uL   nRBC 0.0 0.0 - 0.2 %    Comment: Performed at [facility], [address], [place], [place] [address]  Troponin I (High Sensitivity)     Status: Abnormal   Collection Time: 03/03/23 12:41 PM  Result Value Ref Range   Troponin I (High Sensitivity) 221 (HH) <18 ng/L    Comment: CRITICAL RESULT CALLED TO, READ BACK BY AND VERIFIED WITH Earleen Reaper RN  [phone] M.ALAMANO (NOTE) Elevated high sensitivity troponin I (hsTnI) values and significant  changes across serial measurements may suggest ACS but many other  chronic and acute conditions are known to  elevate hsTnI results.  Refer to the "Links" section for chest pain algorithms and additional  guidance. Performed at [facility], [address], [place], [place] [address]   Troponin I (High Sensitivity)     Status: Abnormal   Collection Time: 03/03/23  2:35 PM  Result Value Ref Range   Troponin I (High Sensitivity) 285 (HH) <18 ng/L    Comment: CRITICAL VALUE NOTED.  VALUE IS CONSISTENT WITH PREVIOUSLY REPORTED AND CALLED VALUE. (NOTE) Elevated high sensitivity troponin I (hsTnI) values and significant  changes across serial measurements may suggest ACS but many other  chronic and acute conditions are known to elevate hsTnI results.  Refer to the "Links" section for chest pain algorithms and additional  guidance. Performed at [facility], [address] [place], [place] [address]   D-dimer, quantitative     Status: None   Collection Time: 03/03/23  3:23 PM  Result Value Ref Range   D-Dimer, Quant <0.27 0.00 - 0.50 ug/mL-FEU    Comment: (NOTE) At the manufacturer cut-off value of 0.5 g/mL FEU, this assay has a negative predictive value of 95-100%.This assay is intended for use in conjunction with a clinical pretest probability (PTP) assessment model to exclude pulmonary embolism (PE) and deep venous thrombosis (DVT) in outpatients suspected of PE or DVT. Results should be correlated with clinical presentation. Performed at [facility], [address] [place], [place] [address]    DG Chest 2 View  Result Date: 03/03/2023 CLINICAL DATA:  chest pain EXAM: CHEST - 2 VIEW COMPARISON:  None Available. FINDINGS: The heart size and mediastinal contours are within normal limits. Both lungs are clear. The visualized skeletal structures are unremarkable. IMPRESSION: No active cardiopulmonary disease. Electronically Signed   By: Judie Petit.  Shick M.D.   On: 03/03/2023 12:59   ECHOCARDIOGRAM COMPLETE  Result Date: 03/02/2023    ECHOCARDIOGRAM REPORT   Patient Name:    Mitchell Mcbride Date of Exam: 03/02/2023 Medical Rec #:  [id number]       Height:       71.0 in Accession #:    [id number]      Weight:       199.0 lb Date of Birth:  1970-11-10       BSA:          2.104 m Patient Age:    51 years        BP:           174/119 mmHg Patient Gender: M  HR:           60 bpm. Exam Location:  [address] Procedure: 2D Echo, Cardiac Doppler, Color Doppler and Intracardiac            Opacification Agent Indications:    Chest Pain R07.9  History:        Patient has prior history of Echocardiogram examinations, most                 recent 12/08/2014. Risk Factors:Dyslipidemia.  Sonographer:    Chanetta Marshall [facility], RDCS Referring Phys: [id number] HEATHER E PEMBERTON IMPRESSIONS  1. Left ventricular ejection fraction, by estimation, is 55 to 60%. The left ventricle has normal function. The left ventricle has no regional wall motion abnormalities. Left ventricular diastolic parameters are consistent with Grade I diastolic dysfunction (impaired relaxation).  2. Right ventricular systolic function is normal. The right ventricular size is normal.  3. The mitral valve is normal in structure. Trivial mitral valve regurgitation.  4. The aortic valve is tricuspid. Aortic valve regurgitation is not visualized. No aortic stenosis is present.  5. The inferior vena cava is normal in size with greater than 50% respiratory variability, suggesting right atrial pressure of 3 mmHg. Comparison(s): No significant change from prior study. FINDINGS  Left Ventricle: Left ventricular ejection fraction, by estimation, is 55 to 60%. The left ventricle has normal function. The left ventricle has no regional wall motion abnormalities. Definity contrast agent was given IV to delineate the left ventricular  endocardial borders. The left ventricular internal cavity size was normal in size. There is no left ventricular hypertrophy. Left ventricular diastolic parameters are consistent with Grade I diastolic dysfunction  (impaired relaxation). Right Ventricle: The right ventricular size is normal. No increase in right ventricular wall thickness. Right ventricular systolic function is normal. Left Atrium: Left atrial size was normal in size. Right Atrium: Right atrial size was normal in size. Pericardium: There is no evidence of pericardial effusion. Mitral Valve: The mitral valve is normal in structure. Trivial mitral valve regurgitation. Tricuspid Valve: The tricuspid valve is normal in structure. Tricuspid valve regurgitation is trivial. Aortic Valve: The aortic valve is tricuspid. Aortic valve regurgitation is not visualized. No aortic stenosis is present. Pulmonic Valve: The pulmonic valve was normal in structure. Pulmonic valve regurgitation is trivial. Aorta: The aortic root and ascending aorta are structurally normal, with no evidence of dilitation. Venous: The inferior vena cava is normal in size with greater than 50% respiratory variability, suggesting right atrial pressure of 3 mmHg. IAS/Shunts: The atrial septum is grossly normal.  LEFT VENTRICLE PLAX 2D LVIDd:         4.90 cm   Diastology LVIDs:         3.40 cm   LV e' medial:    4.79 cm/s LV PW:         1.00 cm   LV E/e' medial:  10.5 LV IVS:        0.90 cm   LV e' lateral:   8.05 cm/s LVOT diam:     2.10 cm   LV E/e' lateral: 6.3 LV SV:         63 LV SV Index:   30 LVOT Area:     3.46 cm  RIGHT VENTRICLE RV Basal diam:  3.00 cm RV Mid diam:    3.10 cm RV S prime:     8.49 cm/s TAPSE (M-mode): 2.5 cm LEFT ATRIUM  Index        RIGHT ATRIUM           Index LA diam:        4.00 cm 1.90 cm/m   RA Area:     12.90 cm LA Vol (A2C):   27.4 ml 13.02 ml/m  RA Volume:   29.20 ml  13.88 ml/m LA Vol (A4C):   22.8 ml 10.84 ml/m LA Biplane Vol: 25.8 ml 12.26 ml/m  AORTIC VALVE LVOT Vmax:   91.95 cm/s LVOT Vmean:  60.350 cm/s LVOT VTI:    0.183 m  AORTA Ao Root diam: 3.70 cm Ao Asc diam:  3.40 cm MITRAL VALVE MV Area (PHT): 3.30 cm    SHUNTS MV Decel Time: 230 msec     Systemic VTI:  0.18 m MV E velocity: 50.40 cm/s  Systemic Diam: 2.10 cm MV A velocity: 62.70 cm/s MV E/A ratio:  0.80 Laurance Flatten MD Electronically signed by Laurance Flatten MD Signature Date/Time: 03/02/2023/2:34:48 PM    Final     Pending Labs Unresulted Labs (From admission, onward)     Start     Ordered   03/04/23 0500  Lipoprotein A (LPA)  Tomorrow morning,   R        03/03/23 1654   03/04/23 0500  Basic metabolic panel  Daily,   R      03/03/23 1654   03/04/23 0500  Lipid panel  Tomorrow morning,   R        03/03/23 1654   03/04/23 0500  CBC  Daily,   R      03/03/23 1654   03/04/23 0500  CBC  Daily,   R      03/03/23 1718   03/03/23 2200  Heparin level (unfractionated)  Once-Timed,   TIMED        03/03/23 1718            Vitals/Pain Today's Vitals   03/03/23 1205 03/03/23 1220 03/03/23 1222 03/03/23 1533  BP: (!) 178/129   (!) 178/122  Pulse: (!) 108   75  Resp: 17   13  Temp: 98.3 F (36.8 C)   97.8 F (36.6 C)  TempSrc: Oral   Oral  SpO2: 99%   100%  Weight:   94.3 kg   Height:   5\' 11"  (1.803 m)   PainSc:  2       Isolation Precautions No active isolations  Medications Medications  nitroGLYCERIN 50 mg in dextrose 5 % 250 mL (0.2 mg/mL) infusion (10 mcg/min Intravenous Rate/Dose Change 03/03/23 1558)  heparin ADULT infusion 100 units/mL (25000 units/279mL) (1,150 Units/hr Intravenous New Bag/Given 03/03/23 1552)  nitroGLYCERIN (NITROSTAT) SL tablet 0.4 mg (has no administration in time range)  acetaminophen (TYLENOL) tablet 650 mg (has no administration in time range)  ondansetron (ZOFRAN) injection 4 mg (has no administration in time range)  metoprolol tartrate (LOPRESSOR) tablet 25 mg (has no administration in time range)  atorvastatin (LIPITOR) tablet 80 mg (80 mg Oral Given 03/03/23 1738)  aspirin chewable tablet 324 mg (324 mg Oral Given 03/03/23 1536)  heparin bolus via infusion 4,000 Units (4,000 Units Intravenous Bolus from Bag 03/03/23 1554)     Mobility walks     Focused Assessments Cardiac Assessment Handoff:    No results found for: "CKTOTAL", "CKMB", "CKMBINDEX", "TROPONINI" Lab Results  Component Value Date   DDIMER <0.27 03/03/2023   Does the Patient currently have chest pain? No    R Recommendations: See Admitting  Provider Note  Report given to:   Additional Notes: Patient have a non stemi. He is on Nitroglycerin 3cc/hr and Heparin 11.5. he did receive 4000 bolus. Patient initial troponin 221. And the second troponin 285. Plain is cardiac cath tomorrow, not sure what time.

## 2023-03-04 ENCOUNTER — Other Ambulatory Visit: Payer: Self-pay

## 2023-03-04 ENCOUNTER — Encounter (HOSPITAL_COMMUNITY)
Admission: EM | Disposition: A | Discharge status: Home / Self Care | Payer: Self-pay | Source: Home / Self Care | Attending: Internal Medicine

## 2023-03-04 ENCOUNTER — Other Ambulatory Visit (HOSPITAL_COMMUNITY): Payer: Self-pay

## 2023-03-04 ENCOUNTER — Inpatient Hospital Stay (HOSPITAL_COMMUNITY): Payer: 59

## 2023-03-04 DIAGNOSIS — G4440 Drug-induced headache, not elsewhere classified, not intractable: Secondary | ICD-10-CM | POA: Diagnosis not present

## 2023-03-04 DIAGNOSIS — Z7982 Long term (current) use of aspirin: Secondary | ICD-10-CM | POA: Diagnosis not present

## 2023-03-04 DIAGNOSIS — Z87891 Personal history of nicotine dependence: Secondary | ICD-10-CM | POA: Diagnosis not present

## 2023-03-04 DIAGNOSIS — R079 Chest pain, unspecified: Secondary | ICD-10-CM | POA: Diagnosis not present

## 2023-03-04 DIAGNOSIS — I959 Hypotension, unspecified: Secondary | ICD-10-CM | POA: Diagnosis not present

## 2023-03-04 DIAGNOSIS — Z79899 Other long term (current) drug therapy: Secondary | ICD-10-CM | POA: Diagnosis not present

## 2023-03-04 DIAGNOSIS — Z8249 Family history of ischemic heart disease and other diseases of the circulatory system: Secondary | ICD-10-CM | POA: Diagnosis not present

## 2023-03-04 DIAGNOSIS — E785 Hyperlipidemia, unspecified: Secondary | ICD-10-CM | POA: Diagnosis not present

## 2023-03-04 DIAGNOSIS — T463X5A Adverse effect of coronary vasodilators, initial encounter: Secondary | ICD-10-CM | POA: Diagnosis not present

## 2023-03-04 DIAGNOSIS — I2510 Atherosclerotic heart disease of native coronary artery without angina pectoris: Secondary | ICD-10-CM | POA: Diagnosis not present

## 2023-03-04 DIAGNOSIS — I214 Non-ST elevation (NSTEMI) myocardial infarction: Secondary | ICD-10-CM | POA: Diagnosis not present

## 2023-03-04 DIAGNOSIS — I10 Essential (primary) hypertension: Secondary | ICD-10-CM | POA: Diagnosis not present

## 2023-03-04 HISTORY — PX: LEFT HEART CATH AND CORONARY ANGIOGRAPHY: CATH118249

## 2023-03-04 HISTORY — PX: CORONARY ULTRASOUND/IVUS: CATH118244

## 2023-03-04 HISTORY — PX: CORONARY STENT INTERVENTION: CATH118234

## 2023-03-04 LAB — ECHOCARDIOGRAM COMPLETE
Area-P 1/2: 2.76 cm2
Height: 71 in
S' Lateral: 3.4 cm
Weight: 3347.2 oz

## 2023-03-04 LAB — POCT ACTIVATED CLOTTING TIME
Activated Clotting Time: 281 seconds
Activated Clotting Time: 287 seconds

## 2023-03-04 SURGERY — LEFT HEART CATH AND CORONARY ANGIOGRAPHY
Anesthesia: LOCAL

## 2023-03-04 MED ORDER — HEPARIN SODIUM (PORCINE) 1000 UNIT/ML IJ SOLN
INTRAMUSCULAR | Status: DC | PRN
  Administered 2023-03-04: 2000 [IU] via INTRAVENOUS
  Administered 2023-03-04 (×2): 5000 [IU] via INTRAVENOUS

## 2023-03-04 MED ORDER — SODIUM CHLORIDE 0.9 % IV BOLUS
250.0000 mL | Freq: Once | INTRAVENOUS | Status: AC
  Administered 2023-03-04: 250 mL via INTRAVENOUS

## 2023-03-04 MED ORDER — FENTANYL CITRATE (PF) 100 MCG/2ML IJ SOLN
INTRAMUSCULAR | Status: AC
  Filled 2023-03-04: qty 2

## 2023-03-04 MED ORDER — HEPARIN (PORCINE) IN NACL 1000-0.9 UT/500ML-% IV SOLN
INTRAVENOUS | Status: DC | PRN
  Administered 2023-03-04 (×2): 500 mL

## 2023-03-04 MED ORDER — SODIUM CHLORIDE 0.9% FLUSH
3.0000 mL | Freq: Two times a day (BID) | INTRAVENOUS | Status: DC
  Administered 2023-03-04 – 2023-03-05 (×2): 3 mL via INTRAVENOUS

## 2023-03-04 MED ORDER — NITROGLYCERIN 1 MG/10 ML FOR IR/CATH LAB
INTRA_ARTERIAL | Status: AC
  Filled 2023-03-04: qty 10

## 2023-03-04 MED ORDER — MIDAZOLAM HCL 2 MG/2ML IJ SOLN
INTRAMUSCULAR | Status: AC
  Filled 2023-03-04: qty 2

## 2023-03-04 MED ORDER — HEPARIN SODIUM (PORCINE) 1000 UNIT/ML IJ SOLN
INTRAMUSCULAR | Status: AC
  Filled 2023-03-04: qty 10

## 2023-03-04 MED ORDER — TICAGRELOR 90 MG PO TABS
ORAL_TABLET | ORAL | Status: DC | PRN
  Administered 2023-03-04: 180 mg via ORAL

## 2023-03-04 MED ORDER — IOHEXOL 350 MG/ML SOLN
INTRAVENOUS | Status: DC | PRN
  Administered 2023-03-04: 86 mL

## 2023-03-04 MED ORDER — FENTANYL CITRATE (PF) 100 MCG/2ML IJ SOLN
INTRAMUSCULAR | Status: DC | PRN
  Administered 2023-03-04: 50 ug via INTRAVENOUS
  Administered 2023-03-04 (×2): 25 ug via INTRAVENOUS

## 2023-03-04 MED ORDER — SODIUM CHLORIDE 0.9 % IV SOLN: INTRAVENOUS | Status: AC

## 2023-03-04 MED ORDER — LIDOCAINE HCL (PF) 1 % IJ SOLN
INTRAMUSCULAR | Status: DC | PRN
  Administered 2023-03-04: 5 mL via INTRADERMAL

## 2023-03-04 MED ORDER — TICAGRELOR 90 MG PO TABS
ORAL_TABLET | ORAL | Status: AC
  Filled 2023-03-04: qty 2

## 2023-03-04 MED ORDER — MIDAZOLAM HCL 2 MG/2ML IJ SOLN
INTRAMUSCULAR | Status: DC | PRN
  Administered 2023-03-04: 2 mg via INTRAVENOUS
  Administered 2023-03-04 (×2): 1 mg via INTRAVENOUS

## 2023-03-04 MED ORDER — HYDRALAZINE HCL 20 MG/ML IJ SOLN
INTRAMUSCULAR | Status: DC | PRN
  Administered 2023-03-04: 10 mg via INTRAVENOUS

## 2023-03-04 MED ORDER — VERAPAMIL HCL 2.5 MG/ML IV SOLN
INTRAVENOUS | Status: AC
  Filled 2023-03-04: qty 2

## 2023-03-04 MED ORDER — HYDRALAZINE HCL 20 MG/ML IJ SOLN
INTRAMUSCULAR | Status: AC
  Filled 2023-03-04: qty 1

## 2023-03-04 MED ORDER — VERAPAMIL HCL 2.5 MG/ML IV SOLN
INTRAVENOUS | Status: DC | PRN
  Administered 2023-03-04: 10 mL via INTRA_ARTERIAL

## 2023-03-04 MED ORDER — HYDRALAZINE HCL 20 MG/ML IJ SOLN: 10.0000 mg | INTRAMUSCULAR | Status: DC | PRN

## 2023-03-04 MED ORDER — NITROGLYCERIN IN D5W 200-5 MCG/ML-% IV SOLN
0.0000 ug/min | INTRAVENOUS | Status: DC
  Administered 2023-03-04: 10 ug/min via INTRAVENOUS

## 2023-03-04 MED ORDER — LABETALOL HCL 5 MG/ML IV SOLN
10.0000 mg | INTRAVENOUS | Status: DC | PRN
  Administered 2023-03-04: 10 mg via INTRAVENOUS
  Filled 2023-03-04: qty 4

## 2023-03-04 MED ORDER — TICAGRELOR 90 MG PO TABS
90.0000 mg | ORAL_TABLET | Freq: Two times a day (BID) | ORAL | Status: DC
  Administered 2023-03-04 – 2023-03-05 (×2): 90 mg via ORAL
  Filled 2023-03-04 (×2): qty 1

## 2023-03-04 MED ORDER — SODIUM CHLORIDE 0.9% FLUSH: 3.0000 mL | INTRAVENOUS | Status: DC | PRN

## 2023-03-04 MED ORDER — ASPIRIN 81 MG PO CHEW
81.0000 mg | CHEWABLE_TABLET | Freq: Every day | ORAL | Status: DC
  Administered 2023-03-05: 81 mg via ORAL
  Filled 2023-03-04: qty 1

## 2023-03-04 MED ORDER — SODIUM CHLORIDE 0.9 % IV SOLN: 250.0000 mL | INTRAVENOUS | Status: DC | PRN

## 2023-03-04 SURGICAL SUPPLY — 20 items
BALLN SAPPHIRE 2.5X12 (BALLOONS) ×1
BALLN ~~LOC~~ EMERGE MR 4.0X15 (BALLOONS) ×1
BALLOON SAPPHIRE 2.5X12 (BALLOONS) IMPLANT
BALLOON ~~LOC~~ EMERGE MR 4.0X15 (BALLOONS) IMPLANT
CATH 5FR JL3.5 JR4 ANG PIG MP (CATHETERS) IMPLANT
CATH OPTICROSS HD (CATHETERS) IMPLANT
CATH VISTA GUIDE 6FR XBLAD3.5 (CATHETERS) IMPLANT
DEVICE RAD COMP TR BAND LRG (VASCULAR PRODUCTS) IMPLANT
GLIDESHEATH SLEND SS 6F .021 (SHEATH) IMPLANT
GUIDEWIRE INQWIRE 1.5J.035X260 (WIRE) IMPLANT
INQWIRE 1.5J .035X260CM (WIRE) ×1
KIT ENCORE 26 ADVANTAGE (KITS) IMPLANT
KIT SINGLE USE MANIFOLD (KITS) IMPLANT
PACK CARDIAC CATHETERIZATION (CUSTOM PROCEDURE TRAY) ×1 IMPLANT
SET ATX-X65L (MISCELLANEOUS) IMPLANT
SLED PULL BACK IVUS (MISCELLANEOUS) IMPLANT
STENT SYNERGY XD 4.0X20 (Permanent Stent) IMPLANT
SYNERGY XD 4.0X20 (Permanent Stent) ×1 IMPLANT
TUBING CIL FLEX 10 FLL-RA (TUBING) ×1 IMPLANT
WIRE COUGAR XT STRL 190CM (WIRE) IMPLANT

## 2023-03-04 NOTE — Progress Notes (Signed)
CARDIAC REHAB PHASE I   Stopped by to provided education post cath. However, staff in room attending to active CP complaints. Will return for education when appropriate. CRP2 order placed for [facility].     Woodroe Chen, RN BSN 03/04/2023 1:49 PM

## 2023-03-04 NOTE — Interval H&P Note (Signed)
History and Physical Interval Note:  03/04/2023 9:43 AM  Mitchell Mcbride  has presented today for surgery, with the diagnosis of nstemi.  The various methods of treatment have been discussed with the patient and family. After consideration of risks, benefits and other options for treatment, the patient has consented to  Procedure(s): LEFT HEART CATH AND CORONARY ANGIOGRAPHY (N/A) as a surgical intervention.  The patient's history has been reviewed, patient examined, no change in status, stable for surgery.  I have reviewed the patient's chart and labs.  Questions were answered to the patient's satisfaction.    Cath Lab Visit (complete for each Cath Lab visit)  Clinical Evaluation Leading to the Procedure:   ACS: Yes.    Non-ACS:    Anginal Classification: CCS III  Anti-ischemic medical therapy: No Therapy  Non-Invasive Test Results: No non-invasive testing performed  Prior CABG: No previous CABG        Verne Carrow

## 2023-03-04 NOTE — Progress Notes (Addendum)
Rounding Note    Patient Name: Mitchell Mcbride Date of Encounter: 03/04/2023  [facility] Cardiologist: Parke Poisson, MD   Subjective   Patient without any complaints.  Denies any chest pain or shortness of breath.  Plans for cardiac catheterization today.  Inpatient Medications    Scheduled Meds:  amLODipine  5 mg Oral Daily   atorvastatin  80 mg Oral Daily   Chlorhexidine Gluconate Cloth  6 each Topical Once   metoprolol tartrate  25 mg Oral BID   Continuous Infusions:  sodium chloride 1 mL/kg/hr (03/04/23 0612)   heparin 1,150 Units/hr (03/04/23 0751)   nitroGLYCERIN 10 mcg/min (03/04/23 0612)   PRN Meds: acetaminophen, nitroGLYCERIN, ondansetron (ZOFRAN) IV   Vital Signs    Vitals:   03/03/23 1900 03/03/23 2300 03/04/23 0300 03/04/23 0719  BP: (!) 115/95 113/78 117/80 (!) 136/92  Pulse: 96 71 63 71  Resp: 19 17 16 18   Temp: 98.4 F (36.9 C) 97.9 F (36.6 C) 98.5 F (36.9 C) 98.1 F (36.7 C)  TempSrc: Oral Oral Oral Oral  SpO2: 97% 95% 98% 99%  Weight:      Height:        Intake/Output Summary (Last 24 hours) at 03/04/2023 0803 Last data filed at 03/04/2023 0612 Gross per 24 hour  Intake 525.11 ml  Output 625 ml  Net -99.89 ml      03/03/2023    6:39 PM 03/03/2023   12:22 PM 12/22/2016   11:19 AM  Last 3 Weights  Weight (lbs) 209 lb 3.2 oz 208 lb 199 lb  Weight (kg) 94.892 kg 94.348 kg 90.266 kg      Telemetry    Normal sinus rhythm heart rate 60 to 80s- Personally Reviewed  ECG    No new tracings- Personally Reviewed  Physical Exam   GEN: No acute distress.   Neck: No JVD Cardiac: RRR, no murmurs, rubs, or gallops.  Respiratory: Clear to auscultation bilaterally. GI: Soft, nontender, non-distended  MS: No edema; No deformity. Neuro:  Nonfocal  Psych: Normal affect   Labs    High Sensitivity Troponin:   Recent Labs  Lab 03/03/23 1241 03/03/23 1435  TROPONINIHS 221* 285*     Chemistry Recent Labs  Lab  03/03/23 1241  NA 135  K 4.0  CL 102  CO2 22  GLUCOSE 113*  BUN 10  CREATININE 1.09  CALCIUM 9.1  GFRNONAA >60  ANIONGAP 11    Lipids No results for input(s): "CHOL", "TRIG", "HDL", "LABVLDL", "LDLCALC", "CHOLHDL" in the last 168 hours.  Hematology Recent Labs  Lab 03/03/23 1241  WBC 9.6  RBC 5.02  HGB 15.2  HCT 43.7  MCV 87.1  MCH 30.3  MCHC 34.8  RDW 12.2  PLT 309   Thyroid No results for input(s): "TSH", "FREET4" in the last 168 hours.  BNPNo results for input(s): "BNP", "PROBNP" in the last 168 hours.  DDimer  Recent Labs  Lab 03/03/23 1523  DDIMER <0.27     Radiology    DG Chest 2 View  Result Date: 03/03/2023 CLINICAL DATA:  chest pain EXAM: CHEST - 2 VIEW COMPARISON:  None Available. FINDINGS: The heart size and mediastinal contours are within normal limits. Both lungs are clear. The visualized skeletal structures are unremarkable. IMPRESSION: No active cardiopulmonary disease. Electronically Signed   By: Judie Petit.  Shick M.D.   On: 03/03/2023 12:59   ECHOCARDIOGRAM COMPLETE  Result Date: 03/02/2023    ECHOCARDIOGRAM REPORT   Patient Name:  Mitchell Mcbride Date of Exam: 03/02/2023 Medical Rec #:  [id number]       Height:       71.0 in Accession #:    [id number]      Weight:       199.0 lb Date of Birth:  1971-01-28       BSA:          2.104 m Patient Age:    51 years        BP:           174/119 mmHg Patient Gender: M               HR:           60 bpm. Exam Location:  [address] Procedure: 2D Echo, Cardiac Doppler, Color Doppler and Intracardiac            Opacification Agent Indications:    Chest Pain R07.9  History:        Patient has prior history of Echocardiogram examinations, most                 recent 12/08/2014. Risk Factors:Dyslipidemia.  Sonographer:    Chanetta Marshall [facility], RDCS Referring Phys: [id number] HEATHER E PEMBERTON IMPRESSIONS  1. Left ventricular ejection fraction, by estimation, is 55 to 60%. The left ventricle has normal function. The left ventricle  has no regional wall motion abnormalities. Left ventricular diastolic parameters are consistent with Grade I diastolic dysfunction (impaired relaxation).  2. Right ventricular systolic function is normal. The right ventricular size is normal.  3. The mitral valve is normal in structure. Trivial mitral valve regurgitation.  4. The aortic valve is tricuspid. Aortic valve regurgitation is not visualized. No aortic stenosis is present.  5. The inferior vena cava is normal in size with greater than 50% respiratory variability, suggesting right atrial pressure of 3 mmHg. Comparison(s): No significant change from prior study. FINDINGS  Left Ventricle: Left ventricular ejection fraction, by estimation, is 55 to 60%. The left ventricle has normal function. The left ventricle has no regional wall motion abnormalities. Definity contrast agent was given IV to delineate the left ventricular  endocardial borders. The left ventricular internal cavity size was normal in size. There is no left ventricular hypertrophy. Left ventricular diastolic parameters are consistent with Grade I diastolic dysfunction (impaired relaxation). Right Ventricle: The right ventricular size is normal. No increase in right ventricular wall thickness. Right ventricular systolic function is normal. Left Atrium: Left atrial size was normal in size. Right Atrium: Right atrial size was normal in size. Pericardium: There is no evidence of pericardial effusion. Mitral Valve: The mitral valve is normal in structure. Trivial mitral valve regurgitation. Tricuspid Valve: The tricuspid valve is normal in structure. Tricuspid valve regurgitation is trivial. Aortic Valve: The aortic valve is tricuspid. Aortic valve regurgitation is not visualized. No aortic stenosis is present. Pulmonic Valve: The pulmonic valve was normal in structure. Pulmonic valve regurgitation is trivial. Aorta: The aortic root and ascending aorta are structurally normal, with no evidence of  dilitation. Venous: The inferior vena cava is normal in size with greater than 50% respiratory variability, suggesting right atrial pressure of 3 mmHg. IAS/Shunts: The atrial septum is grossly normal.  LEFT VENTRICLE PLAX 2D LVIDd:         4.90 cm   Diastology LVIDs:         3.40 cm   LV e' medial:    4.79 cm/s LV PW:  1.00 cm   LV E/e' medial:  10.5 LV IVS:        0.90 cm   LV e' lateral:   8.05 cm/s LVOT diam:     2.10 cm   LV E/e' lateral: 6.3 LV SV:         63 LV SV Index:   30 LVOT Area:     3.46 cm  RIGHT VENTRICLE RV Basal diam:  3.00 cm RV Mid diam:    3.10 cm RV S prime:     8.49 cm/s TAPSE (M-mode): 2.5 cm LEFT ATRIUM             Index        RIGHT ATRIUM           Index LA diam:        4.00 cm 1.90 cm/m   RA Area:     12.90 cm LA Vol (A2C):   27.4 ml 13.02 ml/m  RA Volume:   29.20 ml  13.88 ml/m LA Vol (A4C):   22.8 ml 10.84 ml/m LA Biplane Vol: 25.8 ml 12.26 ml/m  AORTIC VALVE LVOT Vmax:   91.95 cm/s LVOT Vmean:  60.350 cm/s LVOT VTI:    0.183 m  AORTA Ao Root diam: 3.70 cm Ao Asc diam:  3.40 cm MITRAL VALVE MV Area (PHT): 3.30 cm    SHUNTS MV Decel Time: 230 msec    Systemic VTI:  0.18 m MV E velocity: 50.40 cm/s  Systemic Diam: 2.10 cm MV A velocity: 62.70 cm/s MV E/A ratio:  0.80 Laurance Flatten MD Electronically signed by Laurance Flatten MD Signature Date/Time: 03/02/2023/2:34:48 PM    Final     Cardiac Studies   Echocardiogram 03/02/2023 1. Left ventricular ejection fraction, by estimation, is 55 to 60%. The  left ventricle has normal function. The left ventricle has no regional  wall motion abnormalities. Left ventricular diastolic parameters are  consistent with Grade I diastolic  dysfunction (impaired relaxation).   2. Right ventricular systolic function is normal. The right ventricular  size is normal.   3. The mitral valve is normal in structure. Trivial mitral valve  regurgitation.   4. The aortic valve is tricuspid. Aortic valve regurgitation is not   visualized. No aortic stenosis is present.   5. The inferior vena cava is normal in size with greater than 50%  respiratory variability, suggesting right atrial pressure of 3 mmHg.   Comparison(s): No significant change from prior study.    Patient Profile     52 y.o. male with with history of hypertension and has not been followed by a primary care provider who was admitted on 03/03/2023 for the evaluation of NSTEMI.  Assessment & Plan    NSTEMI Presented with substernal chest pain radiating to the left shoulder.  EKG showing no acute ischemic changes.  Troponins 221-285.  Has very strong family history however has been significantly hypertensive, will plan for cardiac catheterization today to determine potential ischemic etiologies vs hypertensive demand ischemia. We have started atorvastatin 80 mg, Lopressor 25 mg twice daily Currently on IV nitroglycerin drip with complete relief of symptoms. LP(a) pending, lipid panel pending Continue IV heparin Recent echocardiogram this month shows normal LVEF without any regional wall motion abnormalities.  Hypertension Initially presented with blood pressures in the 170s.  Now better controlled in the 130s on amlodipine 5 mg.  Still awaiting today's lab results.   For questions or updates, please contact [facility] Please consult [email or web address] for  contact info under        Signed, Abagail Kitchens, PA-C  03/04/2023, 8:03 AM     Patient seen and examined with Yvonna Alanis PA-C.  Agree as above, with the following exceptions and changes as noted below. Feeling ok, mild headache likely from nitroglycerin. No significant chest pain. Gen: NAD, CV: RRR, no murmurs, Lungs: clear, Abd: soft, Extrem: Warm, well perfused, no edema, Neuro/Psych: alert and oriented x 3, normal mood and affect. All available labs, radiology testing, previous records reviewed.  - bp better controlled on nitroglycerin gtt, lopressor and amlodipine. Await cath  for further med titrations. AM labs pending. Likely needs multiple antiHTN agents, unclear if this was more nstemi driven vs hypertensive emergency.  Parke Poisson, MD 03/04/23 8:49 AM

## 2023-03-04 NOTE — H&P (View-Only) (Signed)
Rounding Note    Patient Name: Mitchell Mcbride Date of Encounter: 03/04/2023  [facility] Cardiologist: Parke Poisson, MD   Subjective   Patient without any complaints.  Denies any chest pain or shortness of breath.  Plans for cardiac catheterization today.  Inpatient Medications    Scheduled Meds:  amLODipine  5 mg Oral Daily   atorvastatin  80 mg Oral Daily   Chlorhexidine Gluconate Cloth  6 each Topical Once   metoprolol tartrate  25 mg Oral BID   Continuous Infusions:  sodium chloride 1 mL/kg/hr (03/04/23 0612)   heparin 1,150 Units/hr (03/04/23 0751)   nitroGLYCERIN 10 mcg/min (03/04/23 0612)   PRN Meds: acetaminophen, nitroGLYCERIN, ondansetron (ZOFRAN) IV   Vital Signs    Vitals:   03/03/23 1900 03/03/23 2300 03/04/23 0300 03/04/23 0719  BP: (!) 115/95 113/78 117/80 (!) 136/92  Pulse: 96 71 63 71  Resp: 19 17 16 18   Temp: 98.4 F (36.9 C) 97.9 F (36.6 C) 98.5 F (36.9 C) 98.1 F (36.7 C)  TempSrc: Oral Oral Oral Oral  SpO2: 97% 95% 98% 99%  Weight:      Height:        Intake/Output Summary (Last 24 hours) at 03/04/2023 0803 Last data filed at 03/04/2023 0612 Gross per 24 hour  Intake 525.11 ml  Output 625 ml  Net -99.89 ml      03/03/2023    6:39 PM 03/03/2023   12:22 PM 12/22/2016   11:19 AM  Last 3 Weights  Weight (lbs) 209 lb 3.2 oz 208 lb 199 lb  Weight (kg) 94.892 kg 94.348 kg 90.266 kg      Telemetry    Normal sinus rhythm heart rate 60 to 80s- Personally Reviewed  ECG    No new tracings- Personally Reviewed  Physical Exam   GEN: No acute distress.   Neck: No JVD Cardiac: RRR, no murmurs, rubs, or gallops.  Respiratory: Clear to auscultation bilaterally. GI: Soft, nontender, non-distended  MS: No edema; No deformity. Neuro:  Nonfocal  Psych: Normal affect   Labs    High Sensitivity Troponin:   Recent Labs  Lab 03/03/23 1241 03/03/23 1435  TROPONINIHS 221* 285*     Chemistry Recent Labs  Lab  03/03/23 1241  NA 135  K 4.0  CL 102  CO2 22  GLUCOSE 113*  BUN 10  CREATININE 1.09  CALCIUM 9.1  GFRNONAA >60  ANIONGAP 11    Lipids No results for input(s): "CHOL", "TRIG", "HDL", "LABVLDL", "LDLCALC", "CHOLHDL" in the last 168 hours.  Hematology Recent Labs  Lab 03/03/23 1241  WBC 9.6  RBC 5.02  HGB 15.2  HCT 43.7  MCV 87.1  MCH 30.3  MCHC 34.8  RDW 12.2  PLT 309   Thyroid No results for input(s): "TSH", "FREET4" in the last 168 hours.  BNPNo results for input(s): "BNP", "PROBNP" in the last 168 hours.  DDimer  Recent Labs  Lab 03/03/23 1523  DDIMER <0.27     Radiology    DG Chest 2 View  Result Date: 03/03/2023 CLINICAL DATA:  chest pain EXAM: CHEST - 2 VIEW COMPARISON:  None Available. FINDINGS: The heart size and mediastinal contours are within normal limits. Both lungs are clear. The visualized skeletal structures are unremarkable. IMPRESSION: No active cardiopulmonary disease. Electronically Signed   By: Judie Petit.  Shick M.D.   On: 03/03/2023 12:59   ECHOCARDIOGRAM COMPLETE  Result Date: 03/02/2023    ECHOCARDIOGRAM REPORT   Patient Name:  Mitchell Mcbride Date of Exam: 03/02/2023 Medical Rec #:  [id number]       Height:       71.0 in Accession #:    [id number]      Weight:       199.0 lb Date of Birth:  1971-01-28       BSA:          2.104 m Patient Age:    51 years        BP:           174/119 mmHg Patient Gender: M               HR:           60 bpm. Exam Location:  [address] Procedure: 2D Echo, Cardiac Doppler, Color Doppler and Intracardiac            Opacification Agent Indications:    Chest Pain R07.9  History:        Patient has prior history of Echocardiogram examinations, most                 recent 12/08/2014. Risk Factors:Dyslipidemia.  Sonographer:    Chanetta Marshall [facility], RDCS Referring Phys: [id number] HEATHER E [place] IMPRESSIONS  1. Left ventricular ejection fraction, by estimation, is 55 to 60%. The left ventricle has normal function. The left ventricle  has no regional wall motion abnormalities. Left ventricular diastolic parameters are consistent with Grade I diastolic dysfunction (impaired relaxation).  2. Right ventricular systolic function is normal. The right ventricular size is normal.  3. The mitral valve is normal in structure. Trivial mitral valve regurgitation.  4. The aortic valve is tricuspid. Aortic valve regurgitation is not visualized. No aortic stenosis is present.  5. The inferior vena cava is normal in size with greater than 50% respiratory variability, suggesting right atrial pressure of 3 mmHg. Comparison(s): No significant change from prior study. FINDINGS  Left Ventricle: Left ventricular ejection fraction, by estimation, is 55 to 60%. The left ventricle has normal function. The left ventricle has no regional wall motion abnormalities. Definity contrast agent was given IV to delineate the left ventricular  endocardial borders. The left ventricular internal cavity size was normal in size. There is no left ventricular hypertrophy. Left ventricular diastolic parameters are consistent with Grade I diastolic dysfunction (impaired relaxation). Right Ventricle: The right ventricular size is normal. No increase in right ventricular wall thickness. Right ventricular systolic function is normal. Left Atrium: Left atrial size was normal in size. Right Atrium: Right atrial size was normal in size. Pericardium: There is no evidence of pericardial effusion. Mitral Valve: The mitral valve is normal in structure. Trivial mitral valve regurgitation. Tricuspid Valve: The tricuspid valve is normal in structure. Tricuspid valve regurgitation is trivial. Aortic Valve: The aortic valve is tricuspid. Aortic valve regurgitation is not visualized. No aortic stenosis is present. Pulmonic Valve: The pulmonic valve was normal in structure. Pulmonic valve regurgitation is trivial. Aorta: The aortic root and ascending aorta are structurally normal, with no evidence of  dilitation. Venous: The inferior vena cava is normal in size with greater than 50% respiratory variability, suggesting right atrial pressure of 3 mmHg. IAS/Shunts: The atrial septum is grossly normal.  LEFT VENTRICLE PLAX 2D LVIDd:         4.90 cm   Diastology LVIDs:         3.40 cm   LV e' medial:    4.79 cm/s LV PW:  1.00 cm   LV E/e' medial:  10.5 LV IVS:        0.90 cm   LV e' lateral:   8.05 cm/s LVOT diam:     2.10 cm   LV E/e' lateral: 6.3 LV SV:         63 LV SV Index:   30 LVOT Area:     3.46 cm  RIGHT VENTRICLE RV Basal diam:  3.00 cm RV Mid diam:    3.10 cm RV S prime:     8.49 cm/s TAPSE (M-mode): 2.5 cm LEFT ATRIUM             Index        RIGHT ATRIUM           Index LA diam:        4.00 cm 1.90 cm/m   RA Area:     12.90 cm LA Vol (A2C):   27.4 ml 13.02 ml/m  RA Volume:   29.20 ml  13.88 ml/m LA Vol (A4C):   22.8 ml 10.84 ml/m LA Biplane Vol: 25.8 ml 12.26 ml/m  AORTIC VALVE LVOT Vmax:   91.95 cm/s LVOT Vmean:  60.350 cm/s LVOT VTI:    0.183 m  AORTA Ao Root diam: 3.70 cm Ao Asc diam:  3.40 cm MITRAL VALVE MV Area (PHT): 3.30 cm    SHUNTS MV Decel Time: 230 msec    Systemic VTI:  0.18 m MV E velocity: 50.40 cm/s  Systemic Diam: 2.10 cm MV A velocity: 62.70 cm/s MV E/A ratio:  0.80 Laurance Flatten MD Electronically signed by Laurance Flatten MD Signature Date/Time: 03/02/2023/2:34:48 PM    Final     Cardiac Studies   Echocardiogram 03/02/2023 1. Left ventricular ejection fraction, by estimation, is 55 to 60%. The  left ventricle has normal function. The left ventricle has no regional  wall motion abnormalities. Left ventricular diastolic parameters are  consistent with Grade I diastolic  dysfunction (impaired relaxation).   2. Right ventricular systolic function is normal. The right ventricular  size is normal.   3. The mitral valve is normal in structure. Trivial mitral valve  regurgitation.   4. The aortic valve is tricuspid. Aortic valve regurgitation is not   visualized. No aortic stenosis is present.   5. The inferior vena cava is normal in size with greater than 50%  respiratory variability, suggesting right atrial pressure of 3 mmHg.   Comparison(s): No significant change from prior study.    Patient Profile     52 y.o. male with with history of hypertension and has not been followed by a primary care provider who was admitted on 03/03/2023 for the evaluation of NSTEMI.  Assessment & Plan    NSTEMI Presented with substernal chest pain radiating to the left shoulder.  EKG showing no acute ischemic changes.  Troponins 221-285.  Has very strong family history however has been significantly hypertensive, will plan for cardiac catheterization today to determine potential ischemic etiologies vs hypertensive demand ischemia. We have started atorvastatin 80 mg, Lopressor 25 mg twice daily Currently on IV nitroglycerin drip with complete relief of symptoms. LP(a) pending, lipid panel pending Continue IV heparin Recent echocardiogram this month shows normal LVEF without any regional wall motion abnormalities.  Hypertension Initially presented with blood pressures in the 170s.  Now better controlled in the 130s on amlodipine 5 mg.  Still awaiting today's lab results.   For questions or updates, please contact [facility] Please consult [email or web address] for  contact info under        Signed, Abagail Kitchens, PA-C  03/04/2023, 8:03 AM     Patient seen and examined with Yvonna Alanis PA-C.  Agree as above, with the following exceptions and changes as noted below. Feeling ok, mild headache likely from nitroglycerin. No significant chest pain. Gen: NAD, CV: RRR, no murmurs, Lungs: clear, Abd: soft, Extrem: Warm, well perfused, no edema, Neuro/Psych: alert and oriented x 3, normal mood and affect. All available labs, radiology testing, previous records reviewed.  - bp better controlled on nitroglycerin gtt, lopressor and amlodipine. Await cath  for further med titrations. AM labs pending. Likely needs multiple antiHTN agents, unclear if this was more nstemi driven vs hypertensive emergency.  Parke Poisson, MD 03/04/23 8:49 AM

## 2023-03-04 NOTE — TOC Benefit Eligibility Note (Signed)
Pharmacy Patient Advocate Encounter  Insurance verification completed.    The patient is insured through [facility] Medicare Part D  Ran test claim for Brilinta 90 mg and the current 30 day co-pay is $50.00.   This test claim was processed through [facility]- copay amounts may vary at other pharmacies due to pharmacy/plan contracts, or as the patient moves through the different stages of their insurance plan.    Roland Earl, CPHT Pharmacy Patient Advocate Specialist [facility] Patient Advocate Team Direct Number: [phone]  Fax: [phone]

## 2023-03-04 NOTE — Plan of Care (Signed)
  Problem: Education: Goal: Understanding of cardiac disease, CV risk reduction, and recovery process will improve Outcome: Progressing   Problem: Activity: Goal: Ability to tolerate increased activity will improve Outcome: Progressing   Problem: Cardiac: Goal: Ability to achieve and maintain adequate cardiovascular perfusion will improve Outcome: Progressing   Problem: Activity: Goal: Ability to return to baseline activity level will improve Outcome: Progressing   Problem: Education: Goal: Knowledge of General Education information will improve Description: Including pain rating scale, medication(s)/side effects and non-pharmacologic comfort measures Outcome: Progressing

## 2023-03-04 NOTE — Progress Notes (Signed)
  Echocardiogram 2D Echocardiogram has been performed.  Delcie Roch 03/04/2023, 2:30 PM

## 2023-03-04 NOTE — Progress Notes (Addendum)
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  Post catheterization patient had started develop worsening chest pain with radiation to his jaw and arms, similar to symptoms that brought him in..  I was notified at approximately 12:00pm today that patient had worsening 10 out of 10 chest pain.  Patient seen and examined with Dr. Jacques Navy.  EKG was obtained that showed Wellen T waves in V2.  A serial EKG was obtained 30 minutes later that showed improving T wave abnormality.  IV nitroglycerin was started.  Vital signs stable.  I went to check on patient again later at approximately 1400.  He started complaining of sudden dizziness.  He became pale, cool, clammy, diaphoretic.  Blood pressure was obtained and was 70s systolic.  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  Stat limited echocardiogram ordered, showed normal grossly LVEF.  Patient seen and examined with Dr. Excell Seltzer.  Patient started on IV fluids and also given 250 mL bolus.  Patient with improved symptoms and improved blood pressure.  Of note: Patient was given 10 mg of IV labetalol roughly an hour before event.  Suspect this is likely the driving factor as well as a vagal event.  Will continue to monitor patient.  IV nitroglycerin stopped due to low BP now.  Will discontinue labetalol and hydralazine injections.  Will further titrate BP meds as necessary.  ADDENDUM (attending MD): Called to see patient for hypotension postcardiac catheterization.  Bedside echo currently being performed.  Discussed case with Yvonna Alanis, PA.  Patient appears to have had a vagal reaction post PCI.  Discontinued nitroglycerin, giving some IV fluids, bedside echo personally reviewed and shows normal LV systolic function I do not see any regional wall motion abnormalities.  Will follow-up but he appears to be clinically stable.  EKG shows no acute  changes.  Tonny Bollman 03/04/2023 3:05 PM

## 2023-03-05 ENCOUNTER — Encounter: Payer: Self-pay | Admitting: Cardiology

## 2023-03-05 ENCOUNTER — Telehealth: Payer: Self-pay | Admitting: Cardiology

## 2023-03-05 ENCOUNTER — Other Ambulatory Visit (HOSPITAL_COMMUNITY): Payer: Self-pay

## 2023-03-05 ENCOUNTER — Encounter (HOSPITAL_COMMUNITY): Payer: Self-pay | Admitting: Cardiovascular Disease

## 2023-03-05 DIAGNOSIS — I214 Non-ST elevation (NSTEMI) myocardial infarction: Secondary | ICD-10-CM | POA: Diagnosis not present

## 2023-03-05 DIAGNOSIS — I10 Essential (primary) hypertension: Secondary | ICD-10-CM | POA: Insufficient documentation

## 2023-03-05 MED ORDER — METOPROLOL TARTRATE 25 MG PO TABS
25.0000 mg | ORAL_TABLET | Freq: Two times a day (BID) | ORAL | 6 refills | Status: DC
  Filled 2023-03-05: qty 30, 15d supply, fill #0

## 2023-03-05 MED ORDER — ATORVASTATIN CALCIUM 80 MG PO TABS
80.0000 mg | ORAL_TABLET | Freq: Every day | ORAL | 6 refills | Status: DC
  Filled 2023-03-05: qty 30, 30d supply, fill #0

## 2023-03-05 MED ORDER — NITROGLYCERIN 0.4 MG SL SUBL
0.4000 mg | SUBLINGUAL_TABLET | SUBLINGUAL | 3 refills | Status: DC | PRN
  Filled 2023-03-05: qty 25, 7d supply, fill #0

## 2023-03-05 MED ORDER — TICAGRELOR 90 MG PO TABS
90.0000 mg | ORAL_TABLET | Freq: Two times a day (BID) | ORAL | 7 refills | Status: DC
Start: 2023-03-05 — End: 2023-03-05
  Filled 2023-03-05: qty 60, 30d supply, fill #0

## 2023-03-05 MED ORDER — ISOSORBIDE MONONITRATE ER 30 MG PO TB24
30.0000 mg | ORAL_TABLET | Freq: Every day | ORAL | 6 refills | Status: DC
  Filled 2023-03-05: qty 30, 30d supply, fill #0

## 2023-03-05 MED ORDER — TICAGRELOR 90 MG PO TABS
90.0000 mg | ORAL_TABLET | Freq: Two times a day (BID) | ORAL | 7 refills | Status: DC
  Filled 2023-03-05: qty 90, 45d supply, fill #0

## 2023-03-05 MED ORDER — AMLODIPINE BESYLATE 10 MG PO TABS
10.0000 mg | ORAL_TABLET | Freq: Every day | ORAL | 11 refills | Status: DC
  Filled 2023-03-05: qty 30, 30d supply, fill #0

## 2023-03-05 MED ORDER — ISOSORBIDE MONONITRATE ER 30 MG PO TB24
30.0000 mg | ORAL_TABLET | Freq: Every day | ORAL | Status: DC
  Administered 2023-03-05: 30 mg via ORAL
  Filled 2023-03-05: qty 1

## 2023-03-05 MED ORDER — NITROGLYCERIN 0.4 MG SL SUBL
0.4000 mg | SUBLINGUAL_TABLET | SUBLINGUAL | 3 refills | Status: DC | PRN
  Filled 2023-03-05: qty 25, 1d supply, fill #0

## 2023-03-05 NOTE — Progress Notes (Signed)
CARDIAC REHAB PHASE I   PRE:  Rate/Rhythm: 78 SR  BP:  Sitting: 158/105      SaO2: 98 RA  MODE:  Ambulation: 260 ft   POST:  Rate/Rhythm: 82 SR  BP:  Sitting: 172/104      SaO2: 98 RA Pt ambulated independently in hallway, tolerated well with no CP,SOB or dizziness. B/P was elevated post walk, asked floor RN to check little later. B/P has been elevated this am. Post stent/MI education including site care, restrictions, risk factors, exercise guidelines, NTG use, antiplatelet therapy importance, heart healthy diet, MI booklet and CRP2 reviewed. All questions and concerns addressed. Will refer to [facility] for CRP2. Plan for home later today.   [id number]  Woodroe Chen, RN BSN 03/05/2023 10:46 AM

## 2023-03-05 NOTE — Telephone Encounter (Signed)
Pt is still admitted.  

## 2023-03-05 NOTE — Discharge Summary (Addendum)
Discharge Summary    Patient ID: Mitchell Mcbride MRN: [id number]; DOB: Dec 28, 1970  Admit date: 03/03/2023 Discharge date: 03/05/2023  PCP:  Oneita Hurt No   [facility] Providers Cardiologist:  Parke Poisson, MD   {   Discharge Diagnoses    Principal Problem:   NSTEMI (non-ST elevated myocardial infarction) [facility]) Active Problems:   HLD (hyperlipidemia)   Hypertension    Diagnostic Studies/Procedures    Echocardiogram 03/04/2023 1. Left ventricular ejection fraction, by estimation, is 60 to 65%. The  left ventricle has normal function. The left ventricle has no regional  wall motion abnormalities. There is mild left ventricular hypertrophy.  Left ventricular diastolic parameters  are consistent with Grade I diastolic dysfunction (impaired relaxation).   2. Right ventricular systolic function is normal. The right ventricular  size is normal.   3. The mitral valve is normal in structure. No evidence of mitral valve  regurgitation. No evidence of mitral stenosis.   4. The aortic valve is normal in structure. Aortic valve regurgitation is  not visualized. No aortic stenosis is present.   5. The inferior vena cava is normal in size with greater than 50%  respiratory variability, suggesting right atrial pressure of 3 mmHg.   Left heart catheterization 03/04/2023   Prox LAD lesion is 99% stenosed.   A drug-eluting stent was successfully placed using a SYNERGY XD 4.0X20.   Post intervention, there is a 0% residual stenosis.   Severe proximal LAD stenosis Successful PTCA/DES x 1 proximal LAD No obstructive disease in the dominant Circumflex or small non-dominant RCA LVEDP 11 mmHg   Recommendations: Will continue DAPT with ASA and Brilinta for one year. Continue statin. Possible d/c home later today.  _____________   History of Present Illness     Mitchell Mcbride is a 52 y.o. male with history of hypertension who has not been followed by a primary care provider.  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  He does have a strong family history of coronary artery disease, with a father dying at the age of 110 as well as other family history of coronary artery disease on both sides of his family.  Patient states that he has had ongoing 2 weeks of chest pain with exertion and minimal activity that radiated to his left shoulder.  An echocardiogram was ordered outpatient by Dr. Shari Prows that showed normal LVEF.  He presented to the emergency room and was found to have elevated troponins at 221-285.  EKG without any acute ischemic changes.  He underwent cardiac catheterization for further evaluation.  Hospital Course     Consultants:    NSTEMI Cardiac catheterization revealed 99% stenosis of the proximal LAD which was stented successfully.  Post catheterization he has had ongoing residual chest pain with radiation to his arms and associated numbness and tingling.  Chest pain still present however manageable now.  Will need to continue to monitor anginal symptoms outpatient. Continue DAPT with aspirin and Brilinta 90 mg twice daily for the next year We have started atorvastatin 80 mg, Lopressor 25 mg twice daily, Imdur 30 mg. LP(a) still pending Echocardiogram during this admission shows normal LVEF without any regional wall motion abnormalities  Hypertension We have initiated amlodipine 5 mg titrated up to 10 mg now for better blood pressure control as well as antianginal benefit.  Initially presented in the 170s.  Slightly better controlled now in the 140s-150s.  Hyperlipidemia LDL 156.  Continue high intensity  statin as above.  Education We have discussed at great length the importance of modifiable risk factors and continued improvement with diet, exercise, compliance with medication.  Patient seen and examined by myself and Dr. Jacques Navy and deemed stable for discharge.  Right radial cath site has been inspected and free of acute complications.   Follow-up has been arranged.  Did the patient have an acute coronary syndrome (MI, NSTEMI, STEMI, etc) this admission?:  Yes                               AHA/ACC Clinical Performance & Quality Measures: Aspirin prescribed? - Yes ADP Receptor Inhibitor (Plavix/Clopidogrel, Brilinta/Ticagrelor or Effient/Prasugrel) prescribed (includes medically managed patients)? - Yes Beta Blocker prescribed? - Yes High Intensity Statin (Lipitor 40-80mg  or Crestor 20-40mg ) prescribed? - Yes EF assessed during THIS hospitalization? - Yes For EF <40%, was ACEI/ARB prescribed? - Not Applicable (EF >/= 40%) For EF <40%, Aldosterone Antagonist (Spironolactone or Eplerenone) prescribed? - Not Applicable (EF >/= 40%) Cardiac Rehab Phase II ordered (including medically managed patients)? - Yes       The patient will be scheduled for a TOC follow up appointment in 14 days.  A message has been sent to the [facility] and Scheduling Pool at the office where the patient should be seen for follow up.  _____________  Discharge Vitals Blood pressure (!) 158/105, pulse 81, temperature 97.9 F (36.6 C), temperature source Oral, resp. rate 15, height 5\' 11"  (1.803 m), weight 94.9 kg, SpO2 98%.  Filed Weights   03/03/23 1222 03/03/23 1839  Weight: 94.3 kg 94.9 kg   Physical Exam Constitutional:      Appearance: Normal appearance.  Cardiovascular:     Rate and Rhythm: Normal rate and regular rhythm.     Pulses: Normal pulses.     Heart sounds: Normal heart sounds.  Pulmonary:     Effort: Pulmonary effort is normal.     Breath sounds: Normal breath sounds.  Abdominal:     General: Abdomen is flat. Bowel sounds are normal.     Palpations: Abdomen is soft.  Skin:    General: Skin is warm and dry.     Capillary Refill: Capillary refill takes less than 2 seconds.  Neurological:     Mental Status: He is alert.   Right radial cath site without any signs of infection, erythema, discharge.  No significant  bruising. Labs & Radiologic Studies    CBC Recent Labs    03/04/23 0812 03/05/23 0254  WBC 8.8 13.6*  HGB 14.3 14.0  HCT 41.7 41.5  MCV 88.2 86.8  PLT 247 277   Basic Metabolic Panel Recent Labs    72/53/66 0812 03/05/23 0254  NA 139 135  K 3.9 3.8  CL 103 104  CO2 22 22  GLUCOSE 102* 109*  BUN 12 10  CREATININE 0.98 1.00  CALCIUM 8.9 8.7*   Liver Function Tests No results for input(s): "AST", "ALT", "ALKPHOS", "BILITOT", "PROT", "ALBUMIN" in the last 72 hours. No results for input(s): "LIPASE", "AMYLASE" in the last 72 hours. High Sensitivity Troponin:   Recent Labs  Lab 03/03/23 1241 03/03/23 1435  TROPONINIHS 221* 285*    BNP Invalid input(s): "POCBNP" D-Dimer Recent Labs    03/03/23 1523  DDIMER <0.27   Hemoglobin A1C No results for input(s): "HGBA1C" in the last 72 hours. Fasting Lipid Panel Recent Labs    03/04/23 0812  CHOL 210*  HDL  33*  LDLCALC 156*  TRIG 107  CHOLHDL 6.4   Thyroid Function Tests No results for input(s): "TSH", "T4TOTAL", "T3FREE", "THYROIDAB" in the last 72 hours.  Invalid input(s): "FREET3" _____________  ECHOCARDIOGRAM COMPLETE  Result Date: 03/04/2023    ECHOCARDIOGRAM REPORT   Patient Name:   Mitchell Mcbride Date of Exam: 03/04/2023 Medical Rec #:  [id number]       Height:       71.0 in Accession #:    [id number]      Weight:       209.2 lb Date of Birth:  10/17/1970       BSA:          2.149 m Patient Age:    51 years        BP:           158/103 mmHg Patient Gender: M               HR:           75 bpm. Exam Location:  Inpatient Procedure: 2D Echo, Cardiac Doppler and Color Doppler STAT ECHO Indications:    Chest pain post catheterization  History:        Patient has prior history of Echocardiogram examinations, most                 recent 03/02/2023. NSTEMI, Signs/Symptoms:Chest Pain; Risk                 Factors:Dyslipidemia.  Sonographer:    Delcie Roch RDCS Referring Phys: [id number] SHENG L HALEY  Sonographer  Comments: Suboptimal subcostal window. IMPRESSIONS  1. Left ventricular ejection fraction, by estimation, is 60 to 65%. The left ventricle has normal function. The left ventricle has no regional wall motion abnormalities. There is mild left ventricular hypertrophy. Left ventricular diastolic parameters are consistent with Grade I diastolic dysfunction (impaired relaxation).  2. Right ventricular systolic function is normal. The right ventricular size is normal.  3. The mitral valve is normal in structure. No evidence of mitral valve regurgitation. No evidence of mitral stenosis.  4. The aortic valve is normal in structure. Aortic valve regurgitation is not visualized. No aortic stenosis is present.  5. The inferior vena cava is normal in size with greater than 50% respiratory variability, suggesting right atrial pressure of 3 mmHg. FINDINGS  Left Ventricle: Left ventricular ejection fraction, by estimation, is 60 to 65%. The left ventricle has normal function. The left ventricle has no regional wall motion abnormalities. Global longitudinal strain performed but not reported based on interpreter judgement due to suboptimal tracking. The left ventricular internal cavity size was normal in size. There is mild left ventricular hypertrophy. Left ventricular diastolic parameters are consistent with Grade I diastolic dysfunction (impaired relaxation). Right Ventricle: The right ventricular size is normal. No increase in right ventricular wall thickness. Right ventricular systolic function is normal. Left Atrium: Left atrial size was normal in size. Right Atrium: Right atrial size was normal in size. Pericardium: There is no evidence of pericardial effusion. Mitral Valve: The mitral valve is normal in structure. No evidence of mitral valve regurgitation. No evidence of mitral valve stenosis. Tricuspid Valve: The tricuspid valve is normal in structure. Tricuspid valve regurgitation is not demonstrated. No evidence of  tricuspid stenosis. Aortic Valve: The aortic valve is normal in structure. Aortic valve regurgitation is not visualized. No aortic stenosis is present. Pulmonic Valve: The pulmonic valve was normal in structure. Pulmonic valve regurgitation is not visualized. No evidence  of pulmonic stenosis. Aorta: The aortic root is normal in size and structure. Venous: The inferior vena cava is normal in size with greater than 50% respiratory variability, suggesting right atrial pressure of 3 mmHg. IAS/Shunts: No atrial level shunt detected by color flow Doppler.  LEFT VENTRICLE PLAX 2D LVIDd:         4.30 cm Diastology LVIDs:         3.40 cm LV e' medial:    5.66 cm/s LV PW:         1.10 cm LV E/e' medial:  7.4 LV IVS:        1.40 cm LV e' lateral:   7.29 cm/s                        LV E/e' lateral: 5.7  RIGHT VENTRICLE RV Basal diam:  2.80 cm RV S prime:     10.10 cm/s TAPSE (M-mode): 1.5 cm LEFT ATRIUM             Index        RIGHT ATRIUM           Index LA diam:        3.10 cm 1.44 cm/m   RA Area:     16.60 cm LA Vol (A2C):   33.1 ml 15.40 ml/m  RA Volume:   45.10 ml  20.98 ml/m LA Vol (A4C):   30.8 ml 14.33 ml/m LA Biplane Vol: 34.3 ml 15.96 ml/m  AORTIC VALVE LVOT Vmax:   85.40 cm/s LVOT Vmean:  53.500 cm/s LVOT VTI:    0.148 m  AORTA Ao Asc diam: 3.15 cm MITRAL VALVE MV Area (PHT): 2.76 cm    SHUNTS MV Decel Time: 275 msec    Systemic VTI: 0.15 m MV E velocity: 41.90 cm/s MV A velocity: 63.90 cm/s MV E/A ratio:  0.66 Donato Schultz MD Electronically signed by Donato Schultz MD Signature Date/Time: 03/04/2023/2:33:56 PM    Final    CARDIAC CATHETERIZATION  Result Date: 03/04/2023   Prox LAD lesion is 99% stenosed.   A drug-eluting stent was successfully placed using a SYNERGY XD 4.0X20.   Post intervention, there is a 0% residual stenosis. Severe proximal LAD stenosis Successful PTCA/DES x 1 proximal LAD No obstructive disease in the dominant Circumflex or small non-dominant RCA LVEDP 11 mmHg Recommendations: Will  continue DAPT with ASA and Brilinta for one year. Continue statin. Possible d/c home later today.   DG Chest 2 View  Result Date: 03/03/2023 CLINICAL DATA:  chest pain EXAM: CHEST - 2 VIEW COMPARISON:  None Available. FINDINGS: The heart size and mediastinal contours are within normal limits. Both lungs are clear. The visualized skeletal structures are unremarkable. IMPRESSION: No active cardiopulmonary disease. Electronically Signed   By: Judie Petit.  Shick M.D.   On: 03/03/2023 12:59   ECHOCARDIOGRAM COMPLETE  Result Date: 03/02/2023    ECHOCARDIOGRAM REPORT   Patient Name:   Mitchell Mcbride Date of Exam: 03/02/2023 Medical Rec #:  [id number]       Height:       71.0 in Accession #:    [id number]      Weight:       199.0 lb Date of Birth:  02-18-1971       BSA:          2.104 m Patient Age:    51 years        BP:  174/119 mmHg Patient Gender: M               HR:           60 bpm. Exam Location:  [address] Procedure: 2D Echo, Cardiac Doppler, Color Doppler and Intracardiac            Opacification Agent Indications:    Chest Pain R07.9  History:        Patient has prior history of Echocardiogram examinations, most                 recent 12/08/2014. Risk Factors:Dyslipidemia.  Sonographer:    Chanetta Marshall [facility], RDCS Referring Phys: [id number] HEATHER E PEMBERTON IMPRESSIONS  1. Left ventricular ejection fraction, by estimation, is 55 to 60%. The left ventricle has normal function. The left ventricle has no regional wall motion abnormalities. Left ventricular diastolic parameters are consistent with Grade I diastolic dysfunction (impaired relaxation).  2. Right ventricular systolic function is normal. The right ventricular size is normal.  3. The mitral valve is normal in structure. Trivial mitral valve regurgitation.  4. The aortic valve is tricuspid. Aortic valve regurgitation is not visualized. No aortic stenosis is present.  5. The inferior vena cava is normal in size with greater than 50% respiratory  variability, suggesting right atrial pressure of 3 mmHg. Comparison(s): No significant change from prior study. FINDINGS  Left Ventricle: Left ventricular ejection fraction, by estimation, is 55 to 60%. The left ventricle has normal function. The left ventricle has no regional wall motion abnormalities. Definity contrast agent was given IV to delineate the left ventricular  endocardial borders. The left ventricular internal cavity size was normal in size. There is no left ventricular hypertrophy. Left ventricular diastolic parameters are consistent with Grade I diastolic dysfunction (impaired relaxation). Right Ventricle: The right ventricular size is normal. No increase in right ventricular wall thickness. Right ventricular systolic function is normal. Left Atrium: Left atrial size was normal in size. Right Atrium: Right atrial size was normal in size. Pericardium: There is no evidence of pericardial effusion. Mitral Valve: The mitral valve is normal in structure. Trivial mitral valve regurgitation. Tricuspid Valve: The tricuspid valve is normal in structure. Tricuspid valve regurgitation is trivial. Aortic Valve: The aortic valve is tricuspid. Aortic valve regurgitation is not visualized. No aortic stenosis is present. Pulmonic Valve: The pulmonic valve was normal in structure. Pulmonic valve regurgitation is trivial. Aorta: The aortic root and ascending aorta are structurally normal, with no evidence of dilitation. Venous: The inferior vena cava is normal in size with greater than 50% respiratory variability, suggesting right atrial pressure of 3 mmHg. IAS/Shunts: The atrial septum is grossly normal.  LEFT VENTRICLE PLAX 2D LVIDd:         4.90 cm   Diastology LVIDs:         3.40 cm   LV e' medial:    4.79 cm/s LV PW:         1.00 cm   LV E/e' medial:  10.5 LV IVS:        0.90 cm   LV e' lateral:   8.05 cm/s LVOT diam:     2.10 cm   LV E/e' lateral: 6.3 LV SV:         63 LV SV Index:   30 LVOT Area:     3.46 cm   RIGHT VENTRICLE RV Basal diam:  3.00 cm RV Mid diam:    3.10 cm RV S prime:  8.49 cm/s TAPSE (M-mode): 2.5 cm LEFT ATRIUM             Index        RIGHT ATRIUM           Index LA diam:        4.00 cm 1.90 cm/m   RA Area:     12.90 cm LA Vol (A2C):   27.4 ml 13.02 ml/m  RA Volume:   29.20 ml  13.88 ml/m LA Vol (A4C):   22.8 ml 10.84 ml/m LA Biplane Vol: 25.8 ml 12.26 ml/m  AORTIC VALVE LVOT Vmax:   91.95 cm/s LVOT Vmean:  60.350 cm/s LVOT VTI:    0.183 m  AORTA Ao Root diam: 3.70 cm Ao Asc diam:  3.40 cm MITRAL VALVE MV Area (PHT): 3.30 cm    SHUNTS MV Decel Time: 230 msec    Systemic VTI:  0.18 m MV E velocity: 50.40 cm/s  Systemic Diam: 2.10 cm MV A velocity: 62.70 cm/s MV E/A ratio:  0.80 Laurance Flatten MD Electronically signed by Laurance Flatten MD Signature Date/Time: 03/02/2023/2:34:48 PM    Final    Disposition   Pt is being discharged home today in good condition.  Follow-up Plans & Appointments     Follow-up Information     [facility] Follow up.   Why: To establish a primary care doctor. Contact information: [address] [place] [address] [phone]        Croitoru, Rachelle Hora, MD Follow up.   Specialty: Cardiology Why: 03/19/2023 at 08:40 Contact information: [address] [place] [address] [phone]                Discharge Instructions     Amb Referral to Cardiac Rehabilitation   Complete by: As directed    Diagnosis:  Coronary Stents NSTEMI     After initial evaluation and assessments completed: Virtual Based Care may be provided alone or in conjunction with Phase 2 Cardiac Rehab based on patient barriers.: Yes   Intensive Cardiac Rehabilitation (ICR) [facility] location only OR Traditional Cardiac Rehabilitation (TCR) *If criteria for ICR are not met will enroll in TCR [facility] only): Yes   Diet - low sodium heart healthy   Complete by: As directed    Discharge  instructions   Complete by: As directed    Radial Site Care Refer to this sheet in the next few weeks. These instructions provide you with information on caring for yourself after your procedure. Your caregiver may also give you more specific instructions. Your treatment has been planned according to current medical practices, but problems sometimes occur. Call your caregiver if you have any problems or questions after your procedure. HOME CARE INSTRUCTIONS You may shower the day after the procedure. Remove the bandage (dressing) and gently wash the site with plain soap and water. Gently pat the site dry.  Do not apply powder or lotion to the site.  Do not submerge the affected site in water for 3 to 5 days.  Inspect the site at least twice daily.  Do not flex or bend the affected arm for 24 hours.  No lifting over 5 pounds (2.3 kg) for 5 days after your procedure.  Do not drive home if you are discharged the same day of the procedure. Have someone else drive you.  You may drive 24 hours after the procedure unless otherwise instructed by your caregiver.  What to expect:  Any bruising will usually fade within 1 to 2 weeks.  Blood that collects in the tissue (hematoma) may be painful to the touch. It should usually decrease in size and tenderness within 1 to 2 weeks.  SEEK IMMEDIATE MEDICAL CARE IF: You have unusual pain at the radial site.  You have redness, warmth, swelling, or pain at the radial site.  You have drainage (other than a small amount of blood on the dressing).  You have chills.  You have a fever or persistent symptoms for more than 72 hours.  You have a fever and your symptoms suddenly get worse.  Your arm becomes pale, cool, tingly, or numb.  You have heavy bleeding from the site. Hold pressure on the site.   Increase activity slowly   Complete by: As directed         Discharge Medications   Allergies as of 03/05/2023   No Known Allergies      Medication List      STOP taking these medications    ibuprofen 200 MG tablet Commonly known as: ADVIL       TAKE these medications    amLODipine 10 MG tablet Commonly known as: NORVASC Take 1 tablet (10 mg total) by mouth daily.   aspirin EC 81 MG tablet Take 81 mg by mouth daily. Swallow whole.   atorvastatin 80 MG tablet Commonly known as: LIPITOR Take 1 tablet (80 mg total) by mouth daily. Start taking on: March 06, 2023   isosorbide mononitrate 30 MG 24 hr tablet Commonly known as: IMDUR Take 1 tablet (30 mg total) by mouth daily.   metoprolol tartrate 25 MG tablet Commonly known as: LOPRESSOR Take 1 tablet (25 mg total) by mouth 2 (two) times daily.   nitroGLYCERIN 0.4 MG SL tablet Commonly known as: NITROSTAT Place 1 tablet (0.4 mg total) under the tongue every 5 (five) minutes x 3 doses as needed for chest pain.   ticagrelor 90 MG Tabs tablet Commonly known as: BRILINTA Take 1 tablet (90 mg total) by mouth 2 (two) times daily.           Outstanding Labs/Studies    Duration of Discharge Encounter   Greater than 30 minutes including physician time.  Signed, Abagail Kitchens, PA-C 03/05/2023, 11:02 AM  Patient seen and examined with Yvonna Alanis PA-C.  Agree as above, with the following exceptions and changes as noted below. Chest pain now 1/10, BP remains elevated. Gen: NAD, CV: RRR, no murmurs, Lungs: clear, Abd: soft, Extrem: Warm, well perfused, no edema, Neuro/Psych: alert and oriented x 3, normal mood and affect. All available labs, radiology testing, previous records reviewed. Discussed in detail current care and chest pain post procedure. Stable for discharge. I have instructed the patient that dual antiplatelet therapy should be taken for 1 year without interruption.  We have discussed the consequences of interrupted dual antiplatelet therapy and the risk for in-stent thrombosis. Add imdur for post pci spasm. EKG has improved from immediately post PCI. Appt made for  preferred follow up Dr. Royann Shivers 8/15.    Parke Poisson, MD 03/05/23 11:07 AM

## 2023-03-05 NOTE — Care Management (Signed)
Transition of Care [facility]) - Inpatient Brief Assessment   Patient Details  Name: [place] E Magouirk MRN: [id number] Date of Birth: 1971/08/04  Transition of Care [facility]) CM/SW Contact:    Lockie Pares, RN Phone Number: 03/05/2023, 9:31 AM   Clinical Narrative:  52 year old presented with chest pain had 100% blockage, PCI.  Patient had some complications with re-occurrent chest pain last night . He does not have a PCP at this time. ECHO good, For cardiac rehab OP.Follow up with cardiology, PCP information on AVS  Transition of Care Asessment: Insurance and Status: Insurance coverage has been reviewed Patient has primary care physician: No Home environment has been reviewed: LIves with spouse in [place] Prior level of function:: Independent Prior/Current Home Services: No current home services Social Determinants of Health Reivew: SDOH reviewed no interventions necessary Readmission risk has been reviewed: Yes Transition of care needs: transition of care needs identified, TOC will continue to follow

## 2023-03-05 NOTE — Plan of Care (Signed)
  Problem: Education: Goal: Understanding of cardiac disease, CV risk reduction, and recovery process will improve Outcome: Progressing   Problem: Activity: Goal: Ability to tolerate increased activity will improve Outcome: Progressing   Problem: Cardiac: Goal: Ability to achieve and maintain adequate cardiovascular perfusion will improve Outcome: Progressing   Problem: Health Behavior/Discharge Planning: Goal: Ability to safely manage health-related needs after discharge will improve Outcome: Progressing   Problem: Activity: Goal: Ability to return to baseline activity level will improve Outcome: Progressing   Problem: Cardiovascular: Goal: Ability to achieve and maintain adequate cardiovascular perfusion will improve Outcome: Progressing Goal: Vascular access site(s) Level 0-1 will be maintained Outcome: Progressing

## 2023-03-05 NOTE — Telephone Encounter (Signed)
   Transition of Care Follow-up Phone Call Request    Patient Name: Mitchell Mcbride Date of Birth: 02-Jan-1971 Date of Encounter: 03/05/2023  Primary Care Provider:  Pcp, No Primary Cardiologist:  Parke Poisson, MD  Mitchell E Jaber has been scheduled for a transition of care follow up appointment with a [facility] provider:  Dr. Royann Shivers 03/19/2023  Please reach out to Mitchell E Devaux within 48 hours to confirm appointment and review transition of care protocol questionnaire.  Abagail Kitchens, PA-C  03/05/2023, 11:00 AM

## 2023-03-06 ENCOUNTER — Ambulatory Visit: Payer: 59 | Admitting: Cardiovascular Disease

## 2023-03-06 NOTE — Telephone Encounter (Signed)
Patient contacted regarding discharge from [facility] on 03/05/23  Patient understands to follow up with provider Croitoru  on 03/19/23 at 8:40 am at [facility]. Patient understands discharge instructions? Yes Patient understands medications and regiment? Yes Patient understands to bring all medications to this visit? Yes  Ask patient:  Are you enrolled in My Chart Yes                  Do you have any questions about your medications?    Are you taking your pain medication? None              How is your pain controlled? Headache only               If you require a refill on pain medications, know that the same medication/ amount may not be prescribed or a refill may not be given.  Please contact your pharmacy for refill requests.               Do you have help at home with ADL's?  None needed

## 2023-03-09 ENCOUNTER — Telehealth: Payer: Self-pay | Admitting: *Deleted

## 2023-03-09 ENCOUNTER — Telehealth (HOSPITAL_COMMUNITY): Payer: Self-pay

## 2023-03-09 NOTE — Telephone Encounter (Signed)
Spoke with patient's wife, Mitchell Mcbride (ok per DPR). Offered appointment for pt with Dr. Royann Shivers on 03/11/23 at 2:00pm. Pt's wife accepted this appointment.

## 2023-03-09 NOTE — Telephone Encounter (Signed)
Per phase 1 Cardiac Rehab fax referral to [facility]

## 2023-03-11 ENCOUNTER — Ambulatory Visit: Payer: 59 | Admitting: Cardiovascular Disease

## 2023-03-11 ENCOUNTER — Encounter: Payer: Self-pay | Admitting: Cardiovascular Disease

## 2023-03-11 VITALS — BP 112/74 | HR 74 | Ht 71.0 in | Wt 213.6 lb

## 2023-03-11 DIAGNOSIS — E7849 Other hyperlipidemia: Secondary | ICD-10-CM

## 2023-03-11 DIAGNOSIS — I25110 Atherosclerotic heart disease of native coronary artery with unstable angina pectoris: Secondary | ICD-10-CM | POA: Diagnosis not present

## 2023-03-11 DIAGNOSIS — E785 Hyperlipidemia, unspecified: Secondary | ICD-10-CM | POA: Diagnosis not present

## 2023-03-11 MED ORDER — METOPROLOL SUCCINATE ER 25 MG PO TB24: 25.0000 mg | ORAL_TABLET | Freq: Every day | ORAL | 3 refills | Status: DC

## 2023-03-11 NOTE — Patient Instructions (Signed)
Medication Instructions:  Your physician has recommended you make the following change in your medication:  1) STOP taking metoprolol tartrate (Lopressor) 2) START taking metoprolol succinate (Toprol XL) 25 mg once every evening  *If you need a refill on your cardiac medications before your next appointment, please call your pharmacy*   Lab Work: 3 MONTHS: Fasting lipids and ALT No appointment needed, lab here at the office is open Monday-Friday from 8AM to 4PM and closed daily for lunch from 12:45-1:45.    Follow-Up: At [facility], you and your health needs are our priority.  As part of our continuing mission to provide you with exceptional heart care, we have created designated Provider Care Teams.  These Care Teams include your primary Cardiologist (physician) and Advanced Practice Providers (APPs -  Physician Assistants and Nurse Practitioners) who all work together to provide you with the care you need, when you need it.  Your next appointment:   1 year  Provider:   Thurmon Fair, MD

## 2023-03-11 NOTE — Progress Notes (Signed)
Cardiology Office Note:  .   Date:  03/13/2023  ID:  Mitchell E Narine, DOB 1971/07/24, MRN [id number] PCP: No primary care provider on file.  [facility] Providers Cardiologist:  Thurmon Fair, MD    History of Present Illness: .   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  He was scheduled for an office visit due to a 21-month history strongly suggestive of crescendo angina pectoris, but had to be seen emergently on 03/04/2023.  His anginal pattern is primarily left shoulder discomfort.  This was occurring with increasing frequency and had a prolonged episode that led to hospitalization.  Cardiac catheterization showed 99% ostial LAD artery stenosis that was successfully treated with a drug-eluting stent (Synergy XD 4.0 x 20).  The peak troponin was only 285 and the echocardiogram performed the next day showed normal regional wall motion and EF 60-65%.  No other structural cardiac abnormalities were present.  He has done very well since the procedure he has not had any recurrent chest discomfort similar to his preprocedural angina.  He denies exertional dyspnea, palpitations, dizziness syncope, lower extremity edema, claudication, focal neurological complaints.  He has not had any bleeding problems or any breathing difficulty on aspirin and Brilinta.  He has a very strong family history of early onset CAD including early death (his father passed at age 41).  He quit smoking in 1993.  His HDL is low at 33 and LDL is high at 156.  This is a chronic pattern.  Lipoprotein LP(a) is only marginally elevated.  Although it is not clear that he has a history of hypertension, his blood pressure was quite elevated when he initially presented.  His trend of blood pressure is excellent on a combination of amlodipine and metoprolol.  However he complains of a lot of fatigue.  He has stopped taking isosorbide due to headaches.  His wife  [name] works in [place].  ROS: He complains of fatigue ever since starting the cardiac medications.  Studies Reviewed: .      Echocardiogram 03/04/2023 1. Left ventricular ejection fraction, by estimation, is 60 to 65%. The  left ventricle has normal function. The left ventricle has no regional  wall motion abnormalities. There is mild left ventricular hypertrophy.  Left ventricular diastolic parameters  are consistent with Grade I diastolic dysfunction (impaired relaxation).   2. Right ventricular systolic function is normal. The right ventricular  size is normal.   3. The mitral valve is normal in structure. No evidence of mitral valve  regurgitation. No evidence of mitral stenosis.   4. The aortic valve is normal in structure. Aortic valve regurgitation is  not visualized. No aortic stenosis is present.   5. The inferior vena cava is normal in size with greater than 50%  respiratory variability, suggesting right atrial pressure of 3 mmHg.    Left heart catheterization 03/04/2023   Prox LAD lesion is 99% stenosed.   A drug-eluting stent was successfully placed using a SYNERGY XD 4.0X20.   Post intervention, there is a 0% residual stenosis.   Severe proximal LAD stenosis Successful PTCA/DES x 1 proximal LAD No obstructive disease in the dominant Circumflex or small non-dominant RCA LVEDP 11 mmHg   Recommendations: Will continue DAPT with ASA and Brilinta for one year. Continue statin. Possible d/c home later today.    Risk Assessment/Calculations:          Physical Exam:   VS:  BP  112/74 (BP Location: Left Arm, Patient Position: Sitting, Cuff Size: Large)   Pulse 74   Ht 5\' 11"  (1.803 m)   Wt 213 lb 9.6 oz (96.9 kg)   SpO2 98%   BMI 29.79 kg/m    Wt Readings from Last 3 Encounters:  03/11/23 213 lb 9.6 oz (96.9 kg)  03/03/23 209 lb 3.2 oz (94.9 kg)  12/22/16 199 lb (90.3 kg)    GEN: Well nourished, well developed in no acute distress NECK: No JVD; No carotid  bruits CARDIAC: RRR, no murmurs, rubs, gallops.  Normal distal pulses, no problems at the right radial cath access site. RESPIRATORY:  Clear to auscultation without rales, wheezing or rhonchi  ABDOMEN: Soft, non-tender, non-distended EXTREMITIES:  No edema; No deformity   ASSESSMENT AND PLAN: .    CAD: Had a highly dangerous ostial LAD artery stenosis, but thankfully had a tiny non-STEMI and has excellent long-term prognosis as long as we address his coronary risk factors.  Reviewed the critical importance of dual antiplatelet therapy for the next year.  Discussed the purpose of lipid-lowering therapy, healthy diet and exercise, avoiding cigarettes, maintaining healthy weight.  He is borderline obese at this time.  He has a lot of fatigue and we will reduce his metoprolol to 25 mg daily.  Recommended that he consider cardiac rehab. HLP: On high-dose atorvastatin at least for the next 12 months.  Target LDL less than 70, preferably less than 55.     Dispo: Repeat lipid profile in a few months.  Follow-up in a year.  Signed, Thurmon Fair, MD

## 2023-03-19 ENCOUNTER — Ambulatory Visit: Payer: 59 | Admitting: Cardiovascular Disease

## 2023-04-10 ENCOUNTER — Other Ambulatory Visit (HOSPITAL_COMMUNITY): Payer: Self-pay

## 2023-04-15 ENCOUNTER — Other Ambulatory Visit (HOSPITAL_COMMUNITY): Payer: Self-pay

## 2023-04-24 ENCOUNTER — Other Ambulatory Visit (HOSPITAL_COMMUNITY): Payer: Self-pay

## 2023-06-10 ENCOUNTER — Encounter: Payer: Self-pay | Admitting: Cardiovascular Disease

## 2023-06-10 ENCOUNTER — Other Ambulatory Visit: Payer: Self-pay

## 2023-06-10 DIAGNOSIS — E785 Hyperlipidemia, unspecified: Secondary | ICD-10-CM

## 2023-06-11 ENCOUNTER — Encounter: Payer: Self-pay | Admitting: Cardiovascular Disease

## 2023-06-11 ENCOUNTER — Telehealth: Payer: Self-pay | Admitting: Emergency Medicine

## 2023-06-11 DIAGNOSIS — Z129 Encounter for screening for malignant neoplasm, site unspecified: Secondary | ICD-10-CM

## 2023-06-11 DIAGNOSIS — E785 Hyperlipidemia, unspecified: Secondary | ICD-10-CM

## 2023-06-11 LAB — LIPID PANEL
Chol/HDL Ratio: 4.2 ratio (ref 0.0–5.0)
Cholesterol, Total: 139 mg/dL (ref 100–199)
HDL: 33 mg/dL — ABNORMAL LOW (ref >39)
LDL Chol Calc (NIH): 92 mg/dL (ref 0–99)
Triglycerides: 71 mg/dL (ref 0–149)
VLDL Cholesterol Cal: 14 mg/dL (ref 5–40)

## 2023-06-11 MED ORDER — EZETIMIBE 10 MG PO TABS: 10.0000 mg | ORAL_TABLET | Freq: Every day | ORAL | 11 refills | Status: DC

## 2023-06-11 NOTE — Telephone Encounter (Signed)
Can we please do that, Katie?  Okay to enter order for PSA.  Please ask Sasha in the lab to add it.

## 2023-06-11 NOTE — Telephone Encounter (Signed)
Croitoru, Mihai, MD  Scheryl Marten, RN The "good" HDL cholesterol remains low, unchanged over the last 16 years.  We still do not have a good medication to boost it, best intervention is weight loss and regular physical activity. The bad cholesterol, LDL is much better than it was earlier this year, but not at target.  Would like to have an LDL under 70 (preferably less than 55) for optimal reduction in risk of future coronary blockages and heart attack. I would like to continue the atorvastatin 80 mg daily and add ezetimibe 10 mg once daily.  Please recheck the lipid profile in 3 months. Will try to add the PSA to the blood already drawn, but I am not sure that [facility] will be able to do it since they have already published the full result for the lipid panel  Spoke with the patient. Clarified which pharmacy to send Ezetimibe. Explained that I was unsure if we would be able to add on the PSA to the lab work done today, but will add it to the lab work needed in 3 months= Fasting lipid panel and PSA to be drawn in 3 months. Pt said he will set a reminder to get them drawn and does not need lab slips mailed. Verbalized understanding.   Labs ordered and RX sent.

## 2023-09-11 ENCOUNTER — Other Ambulatory Visit: Payer: Self-pay

## 2023-09-11 DIAGNOSIS — Z129 Encounter for screening for malignant neoplasm, site unspecified: Secondary | ICD-10-CM

## 2023-09-11 DIAGNOSIS — E785 Hyperlipidemia, unspecified: Secondary | ICD-10-CM

## 2023-09-11 LAB — LIPID PANEL
Chol/HDL Ratio: 2.6 {ratio} (ref 0.0–5.0)
Cholesterol, Total: 92 mg/dL — ABNORMAL LOW (ref 100–199)
HDL: 36 mg/dL — ABNORMAL LOW (ref >39)
LDL Chol Calc (NIH): 43 mg/dL (ref 0–99)
Triglycerides: 55 mg/dL (ref 0–149)
VLDL Cholesterol Cal: 13 mg/dL (ref 5–40)

## 2023-09-11 LAB — PSA: Prostate Specific Ag, Serum: 0.5 ng/mL (ref 0.0–4.0)

## 2023-09-12 ENCOUNTER — Encounter: Payer: Self-pay | Admitting: Cardiovascular Disease

## 2024-01-14 ENCOUNTER — Other Ambulatory Visit: Payer: Self-pay

## 2024-01-14 MED ORDER — TICAGRELOR 90 MG PO TABS: 90.0000 mg | ORAL_TABLET | Freq: Two times a day (BID) | ORAL | 0 refills | Status: DC

## 2024-01-15 ENCOUNTER — Other Ambulatory Visit (HOSPITAL_COMMUNITY): Payer: Self-pay

## 2024-01-15 ENCOUNTER — Telehealth: Payer: Self-pay | Admitting: Pharmacy Technician

## 2024-01-15 NOTE — Telephone Encounter (Signed)
 Insurance said brand is now not covered. They will cover generic. Per test claim generic 50.00. [facility]

## 2024-02-03 ENCOUNTER — Encounter: Payer: Self-pay | Admitting: Cardiovascular Disease

## 2024-02-03 NOTE — Telephone Encounter (Signed)
 Spoke with pt regarding an appointment. Pt due for 1 year f/u in August. Appointment made for Sept 10th at 8:40 AM. Pt notified that he does not need labs prior to the appointment. Pt verbalized understanding. All questions if any were answered.

## 2024-02-26 NOTE — Progress Notes (Signed)
 [facility] [address] [place] [address] Phone: [phone] Subjective:   Mitchell Mcbride, am serving as a scribe for Dr. Arthea Claudene.  I'Mitchell seeing this patient by the request  of:  Patient, No Pcp Per  CC: low back pain   YEP:Dlagzrupcz  Mitchell Mcbride is a 53 y.o. male coming in with complaint of L sided lower back pain. Patient states chronic issue. Over the past 6 months has locked up on him 2x. Take a few weeks to get back to normal. Tried some home therapies. Pain can be sharp and can radiate down to knee.    Past medical history significant for kidney stones seen on a CT of abdomen and pelvis in 2000  Past Medical History:  Diagnosis Date   Hyperlipidemia    Kidney stones    Past Surgical History:  Procedure Laterality Date   CARDIAC CATHETERIZATION     CORONARY STENT INTERVENTION N/A 03/04/2023   Procedure: CORONARY STENT INTERVENTION;  Surgeon: Verlin Lonni BIRCH, MD;  Location: [facility];  Service: Cardiovascular;  Laterality: N/A;   CORONARY ULTRASOUND/IVUS N/A 03/04/2023   Procedure: Coronary Ultrasound/IVUS;  Surgeon: Verlin Lonni BIRCH, MD;  Location: [facility];  Service: Cardiovascular;  Laterality: N/A;   fracture right ankle     LEFT HEART CATH AND CORONARY ANGIOGRAPHY N/A 03/04/2023   Procedure: LEFT HEART CATH AND CORONARY ANGIOGRAPHY;  Surgeon: Verlin Lonni BIRCH, MD;  Location: [facility];  Service: Cardiovascular;  Laterality: N/A;   ORIF HUMERUS FRACTURE Right 03/07/2014   Procedure: OPEN REDUCTION INTERNAL FIXATION (ORIF) DISTAL HUMERUS FRACTURE;  Surgeon: Elsie Mussel, MD;  Location: [facility] OR;  Service: Orthopedics;  Laterality: Right;   stent for kidney stones     Social History   Socioeconomic History   Marital status: Married    Spouse name: Not on file   Number of children: Not on file   Years of education: Not on file   Highest education level: Not on file   Occupational History   Not on file  Tobacco Use   Smoking status: Former    Current packs/day: 0.00    Types: Cigarettes    Quit date: 08/05/1991    Years since quitting: 32.5   Smokeless tobacco: Former    Quit date: 03/08/2011  Substance and Sexual Activity   Alcohol use: Yes    Alcohol/week: 5.0 standard drinks of alcohol    Types: 5 Standard drinks or equivalent per week    Comment: social   Drug use: No   Sexual activity: Not on file  Other Topics Concern   Not on file  Social History Narrative   Not on file   Social Drivers of Health   Financial Resource Strain: Not on file  Food Insecurity: Not on file  Transportation Needs: Not on file  Physical Activity: Not on file  Stress: Not on file  Social Connections: Not on file   No Known Allergies Family History  Problem Relation Age of Onset   Heart attack Father      Current Outpatient Medications (Cardiovascular):    amLODipine  (NORVASC ) 10 MG tablet, Take 1 tablet (10 mg total) by mouth daily.   atorvastatin  (LIPITOR ) 80 MG tablet, Take 1 tablet (80 mg total) by mouth daily.   ezetimibe  (ZETIA ) 10 MG tablet, Take 1 tablet (10 mg total) by mouth daily.   metoprolol  succinate (TOPROL  XL) 25 MG 24 hr tablet, Take 1  tablet (25 mg total) by mouth daily.   nitroGLYCERIN  (NITROSTAT ) 0.4 MG SL tablet, Place 1 tablet (0.4 mg total) under the tongue every 5 (five) minutes x 3 doses as needed for chest pain. (Patient not taking: Reported on 03/11/2023)   Current Outpatient Medications (Analgesics):    aspirin  EC 81 MG tablet, Take 81 mg by mouth daily. Swallow whole.  Current Outpatient Medications (Hematological):    ticagrelor  (BRILINTA ) 90 MG TABS tablet, Take 1 tablet (90 mg total) by mouth 2 (two) times daily.    Reviewed prior external information including notes and imaging from  primary care provider As well as notes that were available from care everywhere and other healthcare systems.  Past medical history,  social, surgical and family history all reviewed in electronic medical record.  No pertanent information unless stated regarding to the chief complaint.   Review of Systems:  No headache, visual changes, nausea, vomiting, diarrhea, constipation, dizziness, abdominal pain, skin rash, fevers, chills, night sweats, weight loss, swollen lymph nodes, body aches, joint swelling, chest pain, shortness of breath, mood changes. POSITIVE muscle aches  Objective  Blood pressure (!) 128/90, pulse 66, height 5' 11 (1.803 Mitchell), weight 203 lb (92.1 kg), SpO2 98%.   General: No apparent distress alert and oriented x3 mood and affect normal, dressed appropriately.  HEENT: Pupils equal, extraocular movements intact  Respiratory: Patient's speak in full sentences and does not appear short of breath  Cardiovascular: No lower extremity edema, non tender, no erythema  Low back does have significant loss of lordosis noted.  Some tightness noted.  Negative straight leg test.  Significant tightness with Deri right greater than left.  Neurovascularly intact distally.  Deep tendon reflexes intact.  [id number]; 15 additional minutes spent for Therapeutic exercises as stated in above notes.  This included exercises focusing on stretching, strengthening, with significant focus on eccentric aspects.   Long term goals include an improvement in range of motion, strength, endurance as well as avoiding reinjury. Patient's frequency would include in 1-2 times a day, 3-5 times a week for a duration of 6-12 weeks. Low back exercises that included:  Pelvic tilt/bracing instruction to focus on control of the pelvic girdle and lower abdominal muscles  Glute strengthening exercises, focusing on proper firing of the glutes without engaging the low back muscles Proper stretching techniques for maximum relief for the hamstrings, hip flexors, low back and some rotation where tolerated   Proper technique shown and discussed handout in great detail  with ATC.  All questions were discussed and answered.      Impression and Recommendations:     The above documentation has been reviewed and is accurate and complete Mitchell Mcbride Mitchell Takao Lizer, DO

## 2024-02-29 ENCOUNTER — Encounter: Payer: Self-pay | Admitting: Family Medicine

## 2024-02-29 ENCOUNTER — Ambulatory Visit (INDEPENDENT_AMBULATORY_CARE_PROVIDER_SITE_OTHER)

## 2024-02-29 ENCOUNTER — Ambulatory Visit: Admitting: Family Medicine

## 2024-02-29 VITALS — BP 128/90 | HR 66 | Ht 71.0 in | Wt 203.0 lb

## 2024-02-29 DIAGNOSIS — G8929 Other chronic pain: Secondary | ICD-10-CM

## 2024-02-29 DIAGNOSIS — M545 Low back pain, unspecified: Secondary | ICD-10-CM | POA: Insufficient documentation

## 2024-02-29 MED ORDER — TIZANIDINE HCL 4 MG PO TABS: 4.0000 mg | ORAL_TABLET | Freq: Every day | ORAL | 0 refills | Status: AC

## 2024-02-29 MED ORDER — GABAPENTIN 100 MG PO CAPS: 200.0000 mg | ORAL_CAPSULE | Freq: Every day | ORAL | 0 refills | Status: DC

## 2024-02-29 NOTE — Patient Instructions (Addendum)
 Do prescribed exercises at least 3x a week Gabapentin  200mg   Zanaflex  4mg  for break through pain Xrays today See you again in 6-8 weeks

## 2024-02-29 NOTE — Assessment & Plan Note (Signed)
 Seems to be multifactorial, will get x-rays to further evaluate.  Gabapentin  and Zanaflex  given.  Avoided anti-inflammatories with patient having some history of cardiac.  Discussed that this is a relative contraindication than truly absolute contraindication.  Discussed icing regimen, home exercises, which activities to do in which ones to avoid.  Follow-up again in 6 to 8 weeks.  Could be a potential candidate for osteopathic manipulation.

## 2024-03-07 ENCOUNTER — Ambulatory Visit: Payer: Self-pay | Admitting: Family Medicine

## 2024-04-12 NOTE — Progress Notes (Unsigned)
 [name] [address] [place] [address] Phone: [phone] Subjective:   LILLETTE Berwyn Posey, am serving as a scribe for Dr. Arthea Claudene.  I'm seeing this patient by the request  of:  Patient, No Pcp Per  CC: Left-sided low back pain  YEP:Dlagzrupcz  02/29/2024 Seems to be multifactorial, will get x-rays to further evaluate.  Gabapentin  and Zanaflex  given.  Avoided anti-inflammatories with patient having some history of cardiac.  Discussed that this is a relative contraindication than truly absolute contraindication.  Discussed icing regimen, home exercises, which activities to do in which ones to avoid.  Follow-up again in 6 to 8 weeks.  Could be a potential candidate for osteopathic manipulation.     Update 04/14/2024 [place] E Lesiak is a 53 y.o. male coming in with complaint of left-sided LBP. Patient states that he has not had any flares but pain is the same as last visit.        Past Medical History:  Diagnosis Date   Hyperlipidemia    Kidney stones    Past Surgical History:  Procedure Laterality Date   CARDIAC CATHETERIZATION     CORONARY STENT INTERVENTION N/A 03/04/2023   Procedure: CORONARY STENT INTERVENTION;  Surgeon: Verlin Lonni BIRCH, MD;  Location: [facility];  Service: Cardiovascular;  Laterality: N/A;   CORONARY ULTRASOUND/IVUS N/A 03/04/2023   Procedure: Coronary Ultrasound/IVUS;  Surgeon: Verlin Lonni BIRCH, MD;  [facility]: [facility];  Service: Cardiovascular;  Laterality: N/A;   fracture right ankle     LEFT HEART CATH AND CORONARY ANGIOGRAPHY N/A 03/04/2023   Procedure: LEFT HEART CATH AND CORONARY ANGIOGRAPHY;  Surgeon: Verlin Lonni BIRCH, MD;  Location: [facility];  Service: Cardiovascular;  Laterality: N/A;   ORIF HUMERUS FRACTURE Right 03/07/2014   Procedure: OPEN REDUCTION INTERNAL FIXATION (ORIF) DISTAL HUMERUS FRACTURE;  Surgeon: Elsie Mussel, MD;  Location: [facility];  Service:  Orthopedics;  Laterality: Right;   stent for kidney stones     Social History   Socioeconomic History   Marital status: Married    Spouse name: Not on file   Number of children: Not on file   Years of education: Not on file   Highest education level: Not on file  Occupational History   Not on file  Tobacco Use   Smoking status: Former    Current packs/day: 0.00    Types: Cigarettes    Quit date: 08/05/1991    Years since quitting: 32.7   Smokeless tobacco: Former    Quit date: 03/08/2011  Substance and Sexual Activity   Alcohol use: Yes    Alcohol/week: 5.0 standard drinks of alcohol    Types: 5 Standard drinks or equivalent per week    Comment: social   Drug use: No   Sexual activity: Not on file  Other Topics Concern   Not on file  Social History Narrative   Not on file   Social Drivers of Health   Financial Resource Strain: Not on file  Food Insecurity: Not on file  Transportation Needs: Not on file  Physical Activity: Not on file  Stress: Not on file  Social Connections: Not on file   No Known Allergies Family History  Problem Relation Age of Onset   Heart attack Father      Current Outpatient Medications (Cardiovascular):    amLODipine  (NORVASC ) 10 MG tablet, Take 1 tablet (10 mg total) by mouth daily.   atorvastatin  (LIPITOR ) 80 MG tablet,  Take 1 tablet (80 mg total) by mouth daily.   metoprolol  succinate (TOPROL  XL) 25 MG 24 hr tablet, Take 1 tablet (25 mg total) by mouth daily.   nitroGLYCERIN  (NITROSTAT ) 0.4 MG SL tablet, Place 1 tablet (0.4 mg total) under the tongue every 5 (five) minutes x 3 doses as needed for chest pain.   Current Outpatient Medications (Analgesics):    aspirin  EC 81 MG tablet, Take 81 mg by mouth daily. Swallow whole.   Current Outpatient Medications (Other):    gabapentin  (NEURONTIN ) 100 MG capsule, Take 2 capsules (200 mg total) by mouth at bedtime.   tiZANidine  (ZANAFLEX ) 4 MG tablet, Take 1 tablet (4 mg total) by mouth at  bedtime.   Reviewed prior external information including notes and imaging from  primary care provider As well as notes that were available from care everywhere and other healthcare systems.  Past medical history, social, surgical and family history all reviewed in electronic medical record.  No pertanent information unless stated regarding to the chief complaint.   Review of Systems:  No headache, visual changes, nausea, vomiting, diarrhea, constipation, dizziness, abdominal pain, skin rash, fevers, chills, night sweats, weight loss, swollen lymph nodes, body aches, joint swelling, chest pain, shortness of breath, mood changes. POSITIVE muscle aches  Objective  Blood pressure 112/84, pulse 60, height 5' 11 (1.803 m), weight 205 lb (93 kg), SpO2 97%.   General: No apparent distress alert and oriented x3 mood and affect normal, dressed appropriately.  HEENT: Pupils equal, extraocular movements intact  Respiratory: Patient's speak in full sentences and does not appear short of breath  Cardiovascular: No lower extremity edema, non tender, no erythema  Low back does have significant loss of lordosis.  Tenderness to palpation diffusely.  Patient has some difficulty going from seated to standing position.  Tightness of the straight leg test bilaterally but no true radicular symptoms.  Patient does have weakness noted with 4 out of 5 strength of dorsiflexion on the left side and 1+ DTR of the Achilles.    Impression and Recommendations:     The above documentation has been reviewed and is accurate and complete Zetta Stoneman M Kassondra Geil, DO

## 2024-04-13 ENCOUNTER — Encounter: Payer: Self-pay | Admitting: Cardiovascular Disease

## 2024-04-13 ENCOUNTER — Ambulatory Visit: Attending: Cardiovascular Disease | Admitting: Cardiovascular Disease

## 2024-04-13 VITALS — BP 110/74 | HR 56 | Ht 71.0 in | Wt 204.0 lb

## 2024-04-13 DIAGNOSIS — I2510 Atherosclerotic heart disease of native coronary artery without angina pectoris: Secondary | ICD-10-CM | POA: Diagnosis not present

## 2024-04-13 DIAGNOSIS — E785 Hyperlipidemia, unspecified: Secondary | ICD-10-CM | POA: Diagnosis not present

## 2024-04-13 NOTE — Progress Notes (Signed)
 Cardiology Office Note:  .   Date:  04/13/2024  ID:  Mitchell Mcbride, DOB Oct 17, 1970, MRN [id number] PCP: Patient, No Pcp Per  [facility] Providers Cardiologist:  Jerel Balding, MD    History of Present Illness: .   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  His previous presentation with angina was primarily left shoulder discomfort described as a vice that intensified and began radiating into his upper arm and lower left side of his neck.  He had a 99% ostial stenosis of the LAD artery, but fortunately had minimal troponin leak (peak 295) and had no evidence of any myocardial injury on echocardiography (normal regional wall motion, EF 60 to 65%).  He underwent urgent cardiac catheterization and received a Synergy XD 4.0 x 20 drug-eluting stent to the ostial LAD with an excellent angiographic result.  He has been asymptomatic since then.  He has not had any bleeding problems on dual antiplatelet therapy.  He has been taking high intensity lipid-lowering therapy with both atorvastatin  80 mg daily and ezetimibe  10 mg daily with an excellent LDL cholesterol of 43, but with a chronically low HDL at 36.  He does have a family history of dyslipidemia and early onset CAD (father passed at age 40).  His LP(a) was only marginally elevated.  He is exercising at the gym twice a week swimming roughly 1500 yards and 30 minutes each time and also takes care of all his own yard work.  The patient specifically denies any chest pain at rest or with exertion, dyspnea at rest or with exertion, orthopnea, paroxysmal nocturnal dyspnea, syncope, palpitations, focal neurological deficits, intermittent claudication, lower extremity edema, unexplained weight gain, cough, hemoptysis or wheezing.  He has not had any bleeding problems.  No issues with erectile dysfunction.  His wife Mitchell Mcbride works in [place].  ROS: He complains of  fatigue ever since starting the cardiac medications.  Studies Reviewed: SABRA     EKG Interpretation Date/Time:  Wednesday April 13 2024 08:37:41 EDT Ventricular Rate:  56 PR Interval:  178 QRS Duration:  78 QT Interval:  432 QTC Calculation: 416 R Axis:   27  Text Interpretation: Sinus bradycardia When compared with ECG of 05-Mar-2023 07:05, T wave inversion no longer evident in Anterior leads Confirmed by Janthony Holleman ([id number]) on 04/13/2024 8:50:42 AM           Echocardiogram 03/04/2023 1. Left ventricular ejection fraction, by estimation, is 60 to 65%. The  left ventricle has normal function. The left ventricle has no regional  wall motion abnormalities. There is mild left ventricular hypertrophy.  Left ventricular diastolic parameters  are consistent with Grade I diastolic dysfunction (impaired relaxation).   2. Right ventricular systolic function is normal. The right ventricular  size is normal.   3. The mitral valve is normal in structure. No evidence of mitral valve  regurgitation. No evidence of mitral stenosis.   4. The aortic valve is normal in structure. Aortic valve regurgitation is  not visualized. No aortic stenosis is present.   5. The inferior vena cava is normal in size with greater than 50%  respiratory variability, suggesting right atrial pressure of 3 mmHg.    Left heart catheterization 03/04/2023   Prox LAD lesion is 99% stenosed.   A drug-eluting stent was successfully placed using a SYNERGY XD 4.0X20.   Post intervention, there is a 0% residual stenosis.  Severe proximal LAD stenosis Successful PTCA/DES x 1 proximal LAD No obstructive disease in the dominant Circumflex or small non-dominant RCA LVEDP 11 mmHg   Recommendations: Will continue DAPT with ASA and Brilinta  for one year. Continue statin. Possible d/c home later today.    Risk Assessment/Calculations:          Physical Exam:   VS:  BP 110/74 (BP Location: Left Arm, Patient Position:  Sitting, Cuff Size: Normal)   Pulse (!) 56   Ht 5' 11 (1.803 m)   Wt 204 lb (92.5 kg)   SpO2 99%   BMI 28.45 kg/m    Wt Readings from Last 3 Encounters:  04/13/24 204 lb (92.5 kg)  02/29/24 203 lb (92.1 kg)  03/11/23 213 lb 9.6 oz (96.9 kg)   General: Alert, oriented x3, no distress, appears healthy and fit, although mildly overweight. Head: no evidence of trauma, PERRL, EOMI, no exophtalmos or lid lag, no myxedema, no xanthelasma; normal ears, nose and oropharynx Neck: normal jugular venous pulsations and no hepatojugular reflux; brisk carotid pulses without delay and no carotid bruits Chest: clear to auscultation, no signs of consolidation by percussion or palpation, normal fremitus, symmetrical and full respiratory excursions Cardiovascular: normal position and quality of the apical impulse, regular rhythm, normal first and second heart sounds, no murmurs, rubs or gallops Abdomen: no tenderness or distention, no masses by palpation, no abnormal pulsatility or arterial bruits, normal bowel sounds, no hepatosplenomegaly Extremities: no clubbing, cyanosis or edema; 2+ radial, ulnar and brachial pulses bilaterally; 2+ right femoral, posterior tibial and dorsalis pedis pulses; 2+ left femoral, posterior tibial and dorsalis pedis pulses; no subclavian or femoral bruits Neurological: grossly nonfocal Psych: Normal mood and affect   ASSESSMENT AND PLAN: .   CAD: Asymptomatic.  Stop Brilinta , continue low-dose aspirin , low-dose beta-blocker and statin.  Had a highly dangerous ostial LAD artery stenosis, but thankfully had a tiny non-STEMI and has excellent long-term prognosis as long as we address his coronary risk factors.  He has successfully lost some weight, but could probably afford to lose about another 15 pounds.  Discussed healthy diet and the need to increase exercise to a minimum of 2.5 hours a week.   HLP: Excellent response to high-dose atorvastatin  plus Zetia .  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  I think we can discontinue the Zetia  and just leave him on long term atorvastatin .  Recheck a lipid profile next February.     Dispo: Repeat lipid profile in February and follow-up in 1 year  Signed, Jerel Balding, MD

## 2024-04-13 NOTE — Patient Instructions (Signed)
 Medication Instructions:  STOP Ezetimibe  (Zetia )   *If you need a refill on your cardiac medications before your next appointment, please call your pharmacy*  Lab Work: To be completed in February: lipid panel  If you have labs (blood work) drawn today and your tests are completely normal, you will receive your results only by: MyChart Message (if you have MyChart) OR A paper copy in the mail If you have any lab test that is abnormal or we need to change your treatment, we will call you to review the results.  Testing/Procedures: None ordered today.  Follow-Up: At [facility], you and your health needs are our priority.  As part of our continuing mission to provide you with exceptional heart care, our providers are all part of one team.  This team includes your primary Cardiologist (physician) and Advanced Practice Providers or APPs (Physician Assistants and Nurse Practitioners) who all work together to provide you with the care you need, when you need it.  Your next appointment:   1 year(s)  Provider:   Jerel Balding, MD

## 2024-04-14 ENCOUNTER — Ambulatory Visit: Admitting: Family Medicine

## 2024-04-14 VITALS — BP 112/84 | HR 60 | Ht 71.0 in | Wt 205.0 lb

## 2024-04-14 DIAGNOSIS — R5383 Other fatigue: Secondary | ICD-10-CM

## 2024-04-14 DIAGNOSIS — M5442 Lumbago with sciatica, left side: Secondary | ICD-10-CM | POA: Diagnosis not present

## 2024-04-14 DIAGNOSIS — G8929 Other chronic pain: Secondary | ICD-10-CM

## 2024-04-14 LAB — SEDIMENTATION RATE: Sed Rate: 8 mm/h (ref 0–20)

## 2024-04-14 LAB — COMPREHENSIVE METABOLIC PANEL WITH GFR
ALT: 40 U/L (ref 0–53)
AST: 24 U/L (ref 0–37)
Albumin: 4.4 g/dL (ref 3.5–5.2)
Alkaline Phosphatase: 66 U/L (ref 39–117)
BUN: 23 mg/dL (ref 6–23)
CO2: 27 meq/L (ref 19–32)
Calcium: 9.5 mg/dL (ref 8.4–10.5)
Chloride: 103 meq/L (ref 96–112)
Creatinine, Ser: 1.16 mg/dL (ref 0.40–1.50)
GFR: 72.18 mL/min (ref >60.00)
Glucose, Bld: 98 mg/dL (ref 70–99)
Potassium: 4.3 meq/L (ref 3.5–5.1)
Sodium: 138 meq/L (ref 135–145)
Total Bilirubin: 0.5 mg/dL (ref 0.2–1.2)
Total Protein: 7.4 g/dL (ref 6.0–8.3)

## 2024-04-14 LAB — CBC WITH DIFFERENTIAL/PLATELET
Basophils Absolute: 0 K/uL (ref 0.0–0.1)
Basophils Relative: 0.5 % (ref 0.0–3.0)
Eosinophils Absolute: 0.2 K/uL (ref 0.0–0.7)
Eosinophils Relative: 2.5 % (ref 0.0–5.0)
HCT: 41.6 % (ref 39.0–52.0)
Hemoglobin: 14.3 g/dL (ref 13.0–17.0)
Lymphocytes Relative: 39.5 % (ref 12.0–46.0)
Lymphs Abs: 3.3 K/uL (ref 0.7–4.0)
MCHC: 34.4 g/dL (ref 30.0–36.0)
MCV: 87.1 fl (ref 78.0–100.0)
Monocytes Absolute: 0.5 K/uL (ref 0.1–1.0)
Monocytes Relative: 6.7 % (ref 3.0–12.0)
Neutro Abs: 4.2 K/uL (ref 1.4–7.7)
Neutrophils Relative %: 50.8 % (ref 43.0–77.0)
Platelets: 297 K/uL (ref 150.0–400.0)
RBC: 4.78 Mil/uL (ref 4.22–5.81)
RDW: 13.1 % (ref 11.5–15.5)
WBC: 8.3 K/uL (ref 4.0–10.5)

## 2024-04-14 LAB — IBC PANEL
Iron: 110 ug/dL (ref 42–165)
Saturation Ratios: 34 % (ref 20.0–50.0)
TIBC: 323.4 ug/dL (ref 250.0–450.0)
Transferrin: 231 mg/dL (ref 212.0–360.0)

## 2024-04-14 LAB — TESTOSTERONE: Testosterone: 267.27 ng/dL — ABNORMAL LOW (ref 300.00–890.00)

## 2024-04-14 LAB — C-REACTIVE PROTEIN: CRP: <1 mg/dL (ref 0.5–20.0)

## 2024-04-14 LAB — VITAMIN B12: Vitamin B-12: 211 pg/mL (ref 211–911)

## 2024-04-14 LAB — VITAMIN D 25 HYDROXY (VIT D DEFICIENCY, FRACTURES): VITD: 35.54 ng/mL (ref 30.00–100.00)

## 2024-04-14 LAB — FERRITIN: Ferritin: 292.5 ng/mL (ref 22.0–322.0)

## 2024-04-14 LAB — URIC ACID: Uric Acid, Serum: 7.3 mg/dL (ref 4.0–7.8)

## 2024-04-14 LAB — TSH: TSH: 2.16 u[IU]/mL (ref 0.35–5.50)

## 2024-04-14 NOTE — Assessment & Plan Note (Signed)
 Worsening pain on exam today.  Patient does have positive straight leg test on the left side.  Some weakness noted with dorsiflexion of the foot as well.  Was going to attempt osteopathic manipulation and tried some did have significant amount of tightness.  Would like to get laboratory workup to make sure there is no other abnormality noted but do feel at this point with patient failing formal physical therapy, home exercises, prescription medications including neuromodulators it is time to consider MRI.  Depending on MRI findings patient could be a candidate for potential epidural.  Follow-up with me again 6 to 8 weeks otherwise for follow-up and we will be discussing with him when imaging comes in.

## 2024-04-14 NOTE — Patient Instructions (Signed)
 Labs today MRI lumbar [phone] We will be in touch See me in 6 weeks

## 2024-04-15 ENCOUNTER — Ambulatory Visit: Payer: Self-pay | Admitting: Family Medicine

## 2024-04-16 ENCOUNTER — Ambulatory Visit
Admission: RE | Admit: 2024-04-16 | Discharge: 2024-04-16 | Disposition: A | Discharge status: Home / Self Care | Source: Ambulatory Visit | Attending: Family Medicine | Admitting: Family Medicine

## 2024-04-16 DIAGNOSIS — R5383 Other fatigue: Secondary | ICD-10-CM

## 2024-04-16 LAB — PTH, INTACT AND CALCIUM
Calcium: 9.5 mg/dL (ref 8.6–10.3)
PTH: 8 pg/mL — ABNORMAL LOW (ref 16–77)

## 2024-04-17 LAB — ANGIOTENSIN CONVERTING ENZYME: Angiotensin-Converting Enzyme: 34 U/L (ref 9–67)

## 2024-04-17 LAB — RHEUMATOID FACTOR: Rheumatoid fact SerPl-aCnc: <10 [IU]/mL (ref <14)

## 2024-04-17 LAB — ANA: Anti Nuclear Antibody (ANA): POSITIVE — AB

## 2024-04-17 LAB — ANTI-NUCLEAR AB-TITER (ANA TITER)
ANA TITER: 1:80 {titer} — ABNORMAL HIGH
ANA Titer 1: 1:40 {titer} — ABNORMAL HIGH

## 2024-04-17 LAB — CYCLIC CITRUL PEPTIDE ANTIBODY, IGG: Cyclic Citrullin Peptide Ab: <16 U

## 2024-04-17 LAB — CALCIUM, IONIZED: Calcium, Ion: 5.3 mg/dL (ref 4.7–5.5)

## 2024-04-22 ENCOUNTER — Other Ambulatory Visit: Payer: Self-pay | Admitting: Family Medicine

## 2024-04-22 ENCOUNTER — Encounter: Payer: Self-pay | Admitting: Family Medicine

## 2024-04-22 DIAGNOSIS — G8929 Other chronic pain: Secondary | ICD-10-CM

## 2024-05-09 ENCOUNTER — Encounter: Payer: Self-pay | Admitting: Family Medicine

## 2024-05-09 NOTE — Discharge Instructions (Signed)

## 2024-05-11 ENCOUNTER — Inpatient Hospital Stay
Admission: RE | Admit: 2024-05-11 | Discharge: 2024-05-11 | Disposition: A | Source: Ambulatory Visit | Attending: Family Medicine | Admitting: Family Medicine

## 2024-05-11 DIAGNOSIS — G8929 Other chronic pain: Secondary | ICD-10-CM

## 2024-05-11 MED ORDER — IOPAMIDOL (ISOVUE-M 200) INJECTION 41%
1.0000 mL | Freq: Once | INTRAMUSCULAR | Status: AC
  Administered 2024-05-11: 1 mL via EPIDURAL

## 2024-05-11 MED ORDER — METHYLPREDNISOLONE ACETATE 40 MG/ML INJ SUSP (RADIOLOG
80.0000 mg | Freq: Once | INTRAMUSCULAR | Status: AC
  Administered 2024-05-11: 80 mg via EPIDURAL

## 2024-05-17 ENCOUNTER — Other Ambulatory Visit (HOSPITAL_COMMUNITY): Payer: Self-pay

## 2024-05-25 NOTE — Progress Notes (Unsigned)
 [facility] [address] [place] [address] Phone: [phone] Subjective:   Mitchell Mcbride, am serving as a scribe for Dr. Arthea Claudene.  I'm seeing this patient by the request  of:  Patient, No Pcp Per  CC: Left-sided low back pain.  YEP:Dlagzrupcz  04/14/2024 Worsening pain on exam today.  Patient does have positive straight leg test on the left side.  Some weakness noted with dorsiflexion of the foot as well.  Was going to attempt osteopathic manipulation and tried some did have significant amount of tightness.  Would like to get laboratory workup to make sure there is no other abnormality noted but do feel at this point with patient failing formal physical therapy, home exercises, prescription medications including neuromodulators it is time to consider MRI.  Depending on MRI findings patient could be a candidate for potential epidural.  Follow-up with me again 6 to 8 weeks otherwise for follow-up and we will be discussing with him when imaging comes in.      Update 05/26/2024 Mitchell Mcbride is a 53 y.o. male coming in with complaint of L sided lower back pain.  Was not responding to conservative therapy and sent for an MRI.  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  Patient given an epidural steroid injection at L5-S1 on October 8.  Epidural 05/11/2024. Patient states epidural helped a little. Epi helped with tightness. Still pain with sitting and standing for a long time. Some pain with yard work as well.      Past Medical History:  Diagnosis Date   Hyperlipidemia    Kidney stones    Past Surgical History:  Procedure Laterality Date   CARDIAC CATHETERIZATION     CORONARY STENT INTERVENTION N/A 03/04/2023   Procedure: CORONARY STENT INTERVENTION;  Surgeon: Verlin Lonni BIRCH, MD;  Location: [facility];  Service: Cardiovascular;  Laterality: N/A;   CORONARY  ULTRASOUND/IVUS N/A 03/04/2023   Procedure: Coronary Ultrasound/IVUS;  Surgeon: Verlin Lonni BIRCH, MD;  Location: [facility];  Service: Cardiovascular;  Laterality: N/A;   fracture right ankle     LEFT HEART CATH AND CORONARY ANGIOGRAPHY N/A 03/04/2023   Procedure: LEFT HEART CATH AND CORONARY ANGIOGRAPHY;  Surgeon: Verlin Lonni BIRCH, MD;  Location: [facility];  Service: Cardiovascular;  Laterality: N/A;   ORIF HUMERUS FRACTURE Right 03/07/2014   Procedure: OPEN REDUCTION INTERNAL FIXATION (ORIF) DISTAL HUMERUS FRACTURE;  Surgeon: Elsie Mussel, MD;  Location: [facility] OR;  Service: Orthopedics;  Laterality: Right;   stent for kidney stones     Social History   Socioeconomic History   Marital status: Married    Spouse name: Not on file   Number of children: Not on file   Years of education: Not on file   Highest education level: Not on file  Occupational History   Not on file  Tobacco Use   Smoking status: Former    Current packs/day: 0.00    Types: Cigarettes    Quit date: 08/05/1991    Years since quitting: 32.8   Smokeless tobacco: Former    Quit date: 03/08/2011  Substance and Sexual Activity   Alcohol use: Yes    Alcohol/week: 5.0 standard drinks of alcohol    Types: 5 Standard drinks or equivalent per week    Comment: social   Drug use: No   Sexual activity: Not on file  Other Topics  Concern   Not on file  Social History Narrative   Not on file   Social Drivers of Health   Financial Resource Strain: Not on file  Food Insecurity: Not on file  Transportation Needs: Not on file  Physical Activity: Not on file  Stress: Not on file  Social Connections: Not on file   No Known Allergies Family History  Problem Relation Age of Onset   Heart attack Father      Current Outpatient Medications (Cardiovascular):    amLODipine  (NORVASC ) 10 MG tablet, Take 1 tablet (10 mg total) by mouth daily.   atorvastatin  (LIPITOR ) 80 MG tablet, Take 1 tablet (80 mg  total) by mouth daily.   metoprolol  succinate (TOPROL  XL) 25 MG 24 hr tablet, Take 1 tablet (25 mg total) by mouth daily.   nitroGLYCERIN  (NITROSTAT ) 0.4 MG SL tablet, Place 1 tablet (0.4 mg total) under the tongue every 5 (five) minutes x 3 doses as needed for chest pain.   Current Outpatient Medications (Analgesics):    aspirin  EC 81 MG tablet, Take 81 mg by mouth daily. Swallow whole.   Current Outpatient Medications (Other):    gabapentin  (NEURONTIN ) 100 MG capsule, Take 2 capsules (200 mg total) by mouth at bedtime.   tiZANidine  (ZANAFLEX ) 4 MG tablet, Take 1 tablet (4 mg total) by mouth at bedtime.   Reviewed prior external information including notes and imaging from  primary care provider As well as notes that were available from care everywhere and other healthcare systems.  Past medical history, social, surgical and family history all reviewed in electronic medical record.  No pertanent information unless stated regarding to the chief complaint.   Review of Systems:  No headache, visual changes, nausea, vomiting, diarrhea, constipation, dizziness, abdominal pain, skin rash, fevers, chills, night sweats, weight loss, swollen lymph nodes, body aches, joint swelling, chest pain, shortness of breath, mood changes. POSITIVE muscle aches  Objective  Blood pressure 112/84, pulse 71, height 5' 11 (1.803 m), weight 206 lb (93.4 kg), SpO2 99%.   General: No apparent distress alert and oriented x3 mood and affect normal, dressed appropriately.  HEENT: Pupils equal, extraocular movements intact  Respiratory: Patient's speak in full sentences and does not appear short of breath  Cardiovascular: No lower extremity edema, non tender, no erythema   Low back exam shows still some tightness with extension of the back noted.  Negative straight leg test but does have some tightness with FABER test.  Limitation in flexion extension secondary to some voluntary guarding noted.   Impression and  Recommendations:     The above documentation has been reviewed and is accurate and complete Hao Dion M Makaylin Carlo, DO

## 2024-05-26 ENCOUNTER — Ambulatory Visit (INDEPENDENT_AMBULATORY_CARE_PROVIDER_SITE_OTHER): Admitting: Family Medicine

## 2024-05-26 ENCOUNTER — Encounter: Payer: Self-pay | Admitting: Family Medicine

## 2024-05-26 VITALS — BP 112/84 | HR 71 | Ht 71.0 in | Wt 206.0 lb

## 2024-05-26 DIAGNOSIS — M47816 Spondylosis without myelopathy or radiculopathy, lumbar region: Secondary | ICD-10-CM | POA: Diagnosis not present

## 2024-05-26 NOTE — Patient Instructions (Signed)
 Good to see you  We will try a Medial Branch Block at L4/5 and L5/S1 on the left side with Dr. Eldonna  Depending how you respond then will consider Radiofrequency ablation (RFA) See me again in 2 months but I will be following along during

## 2024-05-26 NOTE — Assessment & Plan Note (Signed)
 Due to patient having cardiovascular risk factors I do think that injections are to be the more beneficial instead of adding anti-inflammatories long-term for this individual.  Do think that patient will do relatively well with it.  Discussed icing regimen and home exercises, do think that we will refer him for what would be more of a medial branch block that could be helpful.  Patient's MRI did show facet arthropathy as well that could be contributing to some of the discomfort and pain.  Increase activity slowly.  Depending patient response to the medial branch block could be a candidate for radiofrequency ablation.  Seems to be more with long-term standing and no longer having radicular symptoms.  Patient is in agreement with the plan.  Follow-up with me again

## 2024-06-17 ENCOUNTER — Ambulatory Visit: Admitting: Physical Medicine and Rehabilitation

## 2024-06-17 ENCOUNTER — Other Ambulatory Visit: Payer: Self-pay

## 2024-06-17 ENCOUNTER — Encounter: Payer: Self-pay | Admitting: Physical Medicine and Rehabilitation

## 2024-06-17 VITALS — BP 144/98 | HR 81

## 2024-06-17 DIAGNOSIS — M47816 Spondylosis without myelopathy or radiculopathy, lumbar region: Secondary | ICD-10-CM

## 2024-06-17 MED ORDER — BUPIVACAINE HCL 0.5 % IJ SOLN
3.0000 mL | Freq: Once | INTRAMUSCULAR | Status: AC
  Administered 2024-06-17: 3 mL

## 2024-06-17 NOTE — Progress Notes (Signed)
 Pain Scale   Average Pain 1 Patient advised he has chronic lower back pain that radiates to left leg when doing yard work, pain decreases when resting and using heat.        +Driver, -BT, -Dye Allergies.

## 2024-06-20 ENCOUNTER — Encounter: Payer: Self-pay | Admitting: Cardiovascular Disease

## 2024-06-20 ENCOUNTER — Other Ambulatory Visit: Payer: Self-pay | Admitting: Physical Medicine and Rehabilitation

## 2024-06-20 DIAGNOSIS — G8929 Other chronic pain: Secondary | ICD-10-CM

## 2024-06-20 DIAGNOSIS — M47816 Spondylosis without myelopathy or radiculopathy, lumbar region: Secondary | ICD-10-CM

## 2024-06-21 NOTE — Procedures (Signed)
 Lumbar Diagnostic Facet Joint Nerve Block with Fluoroscopic Guidance   Patient: Mitchell Mcbride      Date of Birth: 02/11/1971 MRN: [id number] PCP: Patient, No Pcp Per      Visit Date: 06/17/2024   Universal Protocol:    Date/Time: 11/18/257:34 PM  Consent Given By: the patient  Position: PRONE  Additional Comments: Vital signs were monitored before and after the procedure. Patient was prepped and draped in the usual sterile fashion. The correct patient, procedure, and site was verified.   Injection Procedure Details:   Procedure diagnoses:  1. Spondylosis without myelopathy or radiculopathy, lumbar region      Meds Administered:  Meds ordered this encounter  Medications   bupivacaine (MARCAINE) 0.5 % (with pres) injection 3 mL     Laterality: Left  Location/Site: L4-L5, L3 and L4 medial branches and L5-S1, L4 medial branch and L5 dorsal ramus  Needle: 5.0 in., 25 ga.  Short bevel or Quincke spinal needle  Needle Placement: Oblique pedical  Findings:   -Comments: There was excellent flow of contrast along the articular pillars without intravascular flow.  Procedure Details: The fluoroscope beam is vertically oriented in AP and then obliqued 15 to 20 degrees to the ipsilateral side of the desired nerve to achieve the "Scotty dog" appearance.  The skin over the target area of the junction of the superior articulating process and the transverse process (sacral ala if blocking the L5 dorsal rami) was locally anesthetized with a 1 ml volume of 1% Lidocaine  without Epinephrine.  The spinal needle was inserted and advanced in a trajectory view down to the target.   After contact with periosteum and negative aspirate for blood and CSF, correct placement without intravascular or epidural spread was confirmed by injecting 0.5 ml. of Isovue-250.  A spot radiograph was obtained of this image.    Next, a 0.5 ml. volume of the injectate described above was injected. The needle was  then redirected to the other facet joint nerves mentioned above if needed.  Prior to the procedure, the patient was given a Pain Diary which was completed for baseline measurements.  After the procedure, the patient rated their pain every 30 minutes and will continue rating at this frequency for a total of 5 hours.  The patient has been asked to complete the Diary and return to us  by mail, fax or hand delivered as soon as possible.   Additional Comments:  The patient tolerated the procedure well Dressing: 2 x 2 sterile gauze and Band-Aid    Post-procedure details: Patient was observed during the procedure. Post-procedure instructions were reviewed.  Patient left the clinic in stable condition.

## 2024-06-21 NOTE — Progress Notes (Signed)
 Mitchell Mcbride - 53 y.o. male MRN [id number]  Date of birth: 01-21-1971  Office Visit Note: Visit Date: 06/17/2024 PCP: Patient, No Pcp Per Referred by: Claudene Arthea HERO, DO  Subjective: Chief Complaint  Patient presents with   Lower Back - Pain   HPI:  Mitchell Mcbride is a 53 y.o. male who comes in today at the request of Dr. Arthea Claudene for planned Left  L4-5 and L5-S1 Lumbar facet/medial branch block with fluoroscopic guidance.  The patient has failed conservative care including home exercise, medications, time and activity modification.  This injection will be diagnostic and hopefully therapeutic.  Please see requesting physician notes for further details and justification.  Exam has shown concordant pain with facet joint loading.   ROS Otherwise per HPI.  Assessment & Plan: Visit Diagnoses:    ICD-10-CM   1. Spondylosis without myelopathy or radiculopathy, lumbar region  M47.816 XR C-ARM NO REPORT    Nerve Block    bupivacaine  (MARCAINE ) 0.5 % (with pres) injection 3 mL      Plan: No additional findings.   Meds & Orders:  Meds ordered this encounter  Medications   bupivacaine  (MARCAINE ) 0.5 % (with pres) injection 3 mL    Orders Placed This Encounter  Procedures   Nerve Block   XR C-ARM NO REPORT    Follow-up: Return for Review Pain Diary.   Procedures: No procedures performed  Lumbar Diagnostic Facet Joint Nerve Block with Fluoroscopic Guidance   Patient: Mitchell Mcbride      Date of Birth: October 06, 1970 MRN: [id number] PCP: Patient, No Pcp Per      Visit Date: 06/17/2024   Universal Protocol:    Date/Time: 11/18/257:34 PM  Consent Given By: the patient  Position: PRONE  Additional Comments: Vital signs were monitored before and after the procedure. Patient was prepped and draped in the usual sterile fashion. The correct patient, procedure, and site was verified.   Injection Procedure Details:   Procedure diagnoses:  1. Spondylosis without  myelopathy or radiculopathy, lumbar region      Meds Administered:  Meds ordered this encounter  Medications   bupivacaine  (MARCAINE ) 0.5 % (with pres) injection 3 mL     Laterality: Left  Location/Site: L4-L5, L3 and L4 medial branches and L5-S1, L4 medial branch and L5 dorsal ramus  Needle: 5.0 in., 25 ga.  Short bevel or Quincke spinal needle  Needle Placement: Oblique pedical  Findings:   -Comments: There was excellent flow of contrast along the articular pillars without intravascular flow.  Procedure Details: The fluoroscope beam is vertically oriented in AP and then obliqued 15 to 20 degrees to the ipsilateral side of the desired nerve to achieve the "Scotty dog" appearance.  The skin over the target area of the junction of the superior articulating process and the transverse process (sacral ala if blocking the L5 dorsal rami) was locally anesthetized with a 1 ml volume of 1% Lidocaine  without Epinephrine.  The spinal needle was inserted and advanced in a trajectory view down to the target.   After contact with periosteum and negative aspirate for blood and CSF, correct placement without intravascular or epidural spread was confirmed by injecting 0.5 ml. of Isovue -250.  A spot radiograph was obtained of this image.    Next, a 0.5 ml. volume of the injectate described above was injected. The needle was then redirected to the other facet joint nerves mentioned above if needed.  Prior to the procedure, the patient was given  a Pain Diary which was completed for baseline measurements.  After the procedure, the patient rated their pain every 30 minutes and will continue rating at this frequency for a total of 5 hours.  The patient has been asked to complete the Diary and return to us  by mail, fax or hand delivered as soon as possible.   Additional Comments:  The patient tolerated the procedure well Dressing: 2 x 2 sterile gauze and Band-Aid    Post-procedure details: Patient was  observed during the procedure. Post-procedure instructions were reviewed.  Patient left the clinic in stable condition.   Clinical History: No specialty comments available.     Objective:  VS:  HT:    WT:   BMI:     BP:(!) 144/98  HR:81bpm  TEMP: ( )  RESP:  Physical Exam Vitals and nursing note reviewed.  Constitutional:      General: He is not in acute distress.    Appearance: Normal appearance. He is not ill-appearing.  HENT:     Head: Normocephalic and atraumatic.     Right Ear: External ear normal.     Left Ear: External ear normal.     Nose: No congestion.  Eyes:     Extraocular Movements: Extraocular movements intact.  Cardiovascular:     Rate and Rhythm: Normal rate.     Pulses: Normal pulses.  Pulmonary:     Effort: Pulmonary effort is normal. No respiratory distress.  Abdominal:     General: There is no distension.     Palpations: Abdomen is soft.  Musculoskeletal:        General: No tenderness or signs of injury.     Cervical back: Neck supple.     Right lower leg: No edema.     Left lower leg: No edema.     Comments: Patient has good distal strength without clonus.  Skin:    Findings: No erythema or rash.  Neurological:     General: No focal deficit present.     Mental Status: He is alert and oriented to person, place, and time.     Sensory: No sensory deficit.     Motor: No weakness or abnormal muscle tone.     Coordination: Coordination normal.  Psychiatric:        Mood and Affect: Mood normal.        Behavior: Behavior normal.      Imaging: No results found.

## 2024-06-22 MED ORDER — METOPROLOL SUCCINATE ER 25 MG PO TB24: 25.0000 mg | ORAL_TABLET | Freq: Every day | ORAL | 3 refills | Status: AC

## 2024-06-22 MED ORDER — NITROGLYCERIN 0.4 MG SL SUBL: 0.4000 mg | SUBLINGUAL_TABLET | SUBLINGUAL | 3 refills | Status: AC | PRN

## 2024-06-22 MED ORDER — AMLODIPINE BESYLATE 10 MG PO TABS: 10.0000 mg | ORAL_TABLET | Freq: Every day | ORAL | 3 refills | Status: AC

## 2024-06-22 MED ORDER — ATORVASTATIN CALCIUM 80 MG PO TABS: 80.0000 mg | ORAL_TABLET | Freq: Every day | ORAL | 3 refills | Status: AC

## 2024-07-20 ENCOUNTER — Other Ambulatory Visit: Payer: Self-pay

## 2024-07-20 ENCOUNTER — Ambulatory Visit: Admitting: Physical Medicine and Rehabilitation

## 2024-07-20 VITALS — BP 121/82 | HR 64

## 2024-07-20 DIAGNOSIS — M47816 Spondylosis without myelopathy or radiculopathy, lumbar region: Secondary | ICD-10-CM

## 2024-07-20 MED ORDER — BUPIVACAINE HCL 0.5 % IJ SOLN
3.0000 mL | Freq: Once | INTRAMUSCULAR | Status: AC
  Administered 2024-07-20: 3 mL

## 2024-07-20 NOTE — Progress Notes (Unsigned)
 Pain Scale   Average Pain 1 Patient advising  he has lower back pain tat increases when standing and walking an decreases when sitting        +Driver, -BT, -Dye Allergies.

## 2024-07-22 NOTE — Progress Notes (Deleted)
 " [name] [address] [place] [address] Phone: [phone] Subjective:    I'm seeing this patient by the request  of:  Patient, No Pcp Per  CC:   YEP:Dlagzrupcz  05/26/2024 Due to patient having cardiovascular risk factors I do think that injections are to be the more beneficial instead of adding anti-inflammatories long-term for this individual.  Do think that patient will do relatively well with it.  Discussed icing regimen and home exercises, do think that we will refer him for what would be more of a medial branch block that could be helpful.  Patient's MRI did show facet arthropathy as well that could be contributing to some of the discomfort and pain.  Increase activity slowly.  Depending patient response to the medial branch block could be a candidate for radiofrequency ablation.  Seems to be more with long-term standing and no longer having radicular symptoms.  Patient is in agreement with the plan.  Follow-up with me again     Updated 07/25/2024 Mitchell Mcbride is a 53 y.o. male coming in with complaint of back pain       Past Medical History:  Diagnosis Date   Hyperlipidemia    Kidney stones    Past Surgical History:  Procedure Laterality Date   CARDIAC CATHETERIZATION     CORONARY STENT INTERVENTION N/A 03/04/2023   Procedure: CORONARY STENT INTERVENTION;  Surgeon: Verlin Lonni BIRCH, MD;  Location: [facility];  Service: Cardiovascular;  Laterality: N/A;   CORONARY ULTRASOUND/IVUS N/A 03/04/2023   Procedure: Coronary Ultrasound/IVUS;  Surgeon: Verlin Lonni BIRCH, MD;  Location: [facility];  Service: Cardiovascular;  Laterality: N/A;   fracture right ankle     LEFT HEART CATH AND CORONARY ANGIOGRAPHY N/A 03/04/2023   Procedure: LEFT HEART CATH AND CORONARY ANGIOGRAPHY;  Surgeon: Verlin Lonni BIRCH, MD;  Location: [facility];  Service: Cardiovascular;  Laterality: N/A;   ORIF HUMERUS FRACTURE  Right 03/07/2014   Procedure: OPEN REDUCTION INTERNAL FIXATION (ORIF) DISTAL HUMERUS FRACTURE;  Surgeon: Elsie Mussel, MD;  Location: [facility];  Service: Orthopedics;  Laterality: Right;   stent for kidney stones     Social History   Socioeconomic History   Marital status: Married    Spouse name: Not on file   Number of children: Not on file   Years of education: Not on file   Highest education level: Not on file  Occupational History   Not on file  Tobacco Use   Smoking status: Former    Current packs/day: 0.00    Types: Cigarettes    Quit date: 08/05/1991    Years since quitting: 32.9   Smokeless tobacco: Former    Quit date: 03/08/2011  Substance and Sexual Activity   Alcohol use: Yes    Alcohol/week: 5.0 standard drinks of alcohol    Types: 5 Standard drinks or equivalent per week    Comment: social   Drug use: No   Sexual activity: Not on file  Other Topics Concern   Not on file  Social History Narrative   Not on file   Social Drivers of Health   Tobacco Use: Medium Risk (05/26/2024)   Patient History    Smoking Tobacco Use: Former    Smokeless Tobacco Use: Former    Passive Exposure: Not on Stage Manager: Not on file  Food Insecurity: Not on file  Transportation Needs: Not on file  Physical Activity: Not on  file  Stress: Not on file  Social Connections: Not on file  Depression (EYV7-0): Not on file  Alcohol Screen: Not on file  Housing: Not on file  Utilities: Not on file  Health Literacy: Not on file   Allergies[1] Family History  Problem Relation Age of Onset   Heart attack Father     Current Outpatient Medications (Cardiovascular):    amLODipine  (NORVASC ) 10 MG tablet, Take 1 tablet (10 mg total) by mouth daily.   atorvastatin  (LIPITOR ) 80 MG tablet, Take 1 tablet (80 mg total) by mouth daily.   metoprolol  succinate (TOPROL  XL) 25 MG 24 hr tablet, Take 1 tablet (25 mg total) by mouth daily.   nitroGLYCERIN  (NITROSTAT ) 0.4 MG SL  tablet, Place 1 tablet (0.4 mg total) under the tongue every 5 (five) minutes x 3 doses as needed for chest pain.  Current Outpatient Medications (Analgesics):    aspirin  EC 81 MG tablet, Take 81 mg by mouth daily. Swallow whole.  Current Facility-Administered Medications (Analgesics):    bupivacaine  (MARCAINE ) 0.5 % (with pres) injection 3 mL  Current Outpatient Medications (Other):    gabapentin  (NEURONTIN ) 100 MG capsule, Take 2 capsules (200 mg total) by mouth at bedtime.   tiZANidine  (ZANAFLEX ) 4 MG tablet, Take 1 tablet (4 mg total) by mouth at bedtime.   Reviewed prior external information including notes and imaging from  primary care provider As well as notes that were available from care everywhere and other healthcare systems.  Past medical history, social, surgical and family history all reviewed in electronic medical record.  No pertanent information unless stated regarding to the chief complaint.   Review of Systems:  No headache, visual changes, nausea, vomiting, diarrhea, constipation, dizziness, abdominal pain, skin rash, fevers, chills, night sweats, weight loss, swollen lymph nodes, body aches, joint swelling, chest pain, shortness of breath, mood changes. POSITIVE muscle aches  Objective  There were no vitals taken for this visit.   General: No apparent distress alert and oriented x3 mood and affect normal, dressed appropriately.  HEENT: Pupils equal, extraocular movements intact  Respiratory: Patient's speak in full sentences and does not appear short of breath  Cardiovascular: No lower extremity edema, non tender, no erythema      Impression and Recommendations:           [1] No Known Allergies  "

## 2024-07-23 ENCOUNTER — Telehealth: Admitting: Physician Assistant

## 2024-07-23 DIAGNOSIS — J019 Acute sinusitis, unspecified: Secondary | ICD-10-CM | POA: Diagnosis not present

## 2024-07-23 DIAGNOSIS — B9689 Other specified bacterial agents as the cause of diseases classified elsewhere: Secondary | ICD-10-CM

## 2024-07-23 MED ORDER — PROMETHAZINE-DM 6.25-15 MG/5ML PO SYRP: 5.0000 mL | ORAL_SOLUTION | Freq: Four times a day (QID) | ORAL | 0 refills | Status: AC | PRN

## 2024-07-23 MED ORDER — FLUTICASONE PROPIONATE 50 MCG/ACT NA SUSP: 2.0000 | Freq: Every day | NASAL | 6 refills | Status: AC

## 2024-07-23 MED ORDER — AMOXICILLIN-POT CLAVULANATE 875-125 MG PO TABS: 1.0000 | ORAL_TABLET | Freq: Two times a day (BID) | ORAL | 0 refills | Status: AC

## 2024-07-23 NOTE — Patient Instructions (Signed)
" °  Mitchell Mcbride, thank you for joining Teena Shuck, PA-C for today's virtual visit.  While this provider is not your primary care provider (PCP), if your PCP is located in our provider database this encounter information will be shared with them immediately following your visit.   A [facility] MyChart account gives you access to today's visit and all your visits, tests, and labs performed at [facility]  click here if you don't have a [facility] MyChart account or go to [email or web address]  Consent: (Patient) Mitchell Mcbride provided verbal consent for this virtual visit at the beginning of the encounter.  Current Medications:  Current Outpatient Medications:    amLODipine  (NORVASC ) 10 MG tablet, Take 1 tablet (10 mg total) by mouth daily., Disp: 90 tablet, Rfl: 3   aspirin  EC 81 MG tablet, Take 81 mg by mouth daily. Swallow whole., Disp: , Rfl:    atorvastatin  (LIPITOR ) 80 MG tablet, Take 1 tablet (80 mg total) by mouth daily., Disp: 90 tablet, Rfl: 3   gabapentin  (NEURONTIN ) 100 MG capsule, Take 2 capsules (200 mg total) by mouth at bedtime., Disp: 180 capsule, Rfl: 0   metoprolol  succinate (TOPROL  XL) 25 MG 24 hr tablet, Take 1 tablet (25 mg total) by mouth daily., Disp: 90 tablet, Rfl: 3   nitroGLYCERIN  (NITROSTAT ) 0.4 MG SL tablet, Place 1 tablet (0.4 mg total) under the tongue every 5 (five) minutes x 3 doses as needed for chest pain., Disp: 25 tablet, Rfl: 3   tiZANidine  (ZANAFLEX ) 4 MG tablet, Take 1 tablet (4 mg total) by mouth at bedtime., Disp: 30 tablet, Rfl: 0  Current Facility-Administered Medications:    bupivacaine  (MARCAINE ) 0.5 % (with pres) injection 3 mL, 3 mL, Other, Once,    Medications ordered in this encounter:  No orders of the defined types were placed in this encounter.    *If you need refills on other medications prior to your next appointment, please contact your pharmacy*  Follow-Up: Call back or seek an in-person evaluation if the  symptoms worsen or if the condition fails to improve as anticipated.  [facility] [phone]  Other Instructions Follow up with primary provider in 24-48 hours. Report to nearest ER with any worsening symptoms.    If you have been instructed to have an in-person evaluation today at a local Urgent Care facility, please use the link below. It will take you to a list of all of our available [facility], including address, phone number and hours of operation. Please do not delay care.  [facility]  If you or a family member do not have a primary care provider, use the link below to schedule a visit and establish care. When you choose a [facility] primary care physician or advanced practice provider, you gain a long-term partner in health. Find a Primary Care Provider  Learn more about [facility]'s in-office and virtual care options: [facility] - Get Care Now  "

## 2024-07-23 NOTE — Progress Notes (Signed)
 " Virtual Visit Consent   Mitchell Mcbride, you are scheduled for a virtual visit with a [facility] provider today. Just as with appointments in the office, your consent must be obtained to participate. Your consent will be active for this visit and any virtual visit you may have with one of our providers in the next 365 days. If you have a MyChart account, a copy of this consent can be sent to you electronically.  As this is a virtual visit, video technology does not allow for your provider to perform a traditional examination. This may limit your provider's ability to fully assess your condition. If your provider identifies any concerns that need to be evaluated in person or the need to arrange testing (such as labs, EKG, etc.), we will make arrangements to do so. Although advances in technology are sophisticated, we cannot ensure that it will always work on either your end or our end. If the connection with a video visit is poor, the visit may have to be switched to a telephone visit. With either a video or telephone visit, we are not always able to ensure that we have a secure connection.  By engaging in this virtual visit, you consent to the provision of healthcare and authorize for your insurance to be billed (if applicable) for the services provided during this visit. Depending on your insurance coverage, you may receive a charge related to this service.  I need to obtain your verbal consent now. Are you willing to proceed with your visit today? Mitchell Mcbride has provided verbal consent on 07/23/2024 for a virtual visit (video or telephone). Teena Shuck, [place]  Date: 07/23/2024 3:12 PM   Virtual Visit via Video Note   I, Teena Shuck, connected with  Mitchell Mcbride  ([id number], 1970-12-01) on 07/23/2024 at  3:15 PM EST by a video-enabled telemedicine application and verified that I am speaking with the correct person using two identifiers.  Location: Patient: Virtual Visit Location  Patient: Home Provider: Virtual Visit Location Provider: Home Office   I discussed the limitations of evaluation and management by telemedicine and the availability of in person appointments. The patient expressed understanding and agreed to proceed.    History of Present Illness: Mitchell Mcbride is a 53 y.o. who identifies as a male who was assigned male at birth, and is being seen today for ongoing sinsus issues for 3 weeks. SABRA  HPI: Sinusitis This is a new problem. The current episode started 1 to 4 weeks ago. The problem is unchanged. The maximum temperature recorded prior to his arrival was 100.4 - 100.9 F. His pain is at a severity of 0/10. The pain is mild. Associated symptoms include congestion, coughing and sinus pressure. Treatments tried: cough meds and mucinex. The treatment provided mild relief.    Problems:  Patient Active Problem List   Diagnosis Date Noted   Low back pain 02/29/2024   Hypertension 03/05/2023   NSTEMI (non-ST elevated myocardial infarction) (HCC) 03/03/2023   HLD (hyperlipidemia) 05/22/2016   Humerus distal fracture 03/07/2014    Allergies: Allergies[1] Medications: Current Medications[2]  Observations/Objective: Patient is well-developed, well-nourished in no acute distress.  Resting comfortably  at home.  Head is normocephalic, atraumatic.  No labored breathing.  Speech is clear and coherent with logical content.  Patient is alert and oriented at baseline.    Assessment and Plan: 1. Acute bacterial sinusitis (Primary)  Presentation consistent with sinusitis.  No evidence of other bacterial infections including pneumonia, meningitis,  pharyngitis, otitis media.  Discussed that this fits picture of  bacterial sinus itis and that due to type and duration of symptoms and exam findings, we will treat as bacterial sinusitis.  Antibiotics prescribed. Advised to continue ibuprofen and Tylenol  at home. Patient is to follow up with primary physician if having  continued symptoms.   Advised patient on supportive therapies, including using a cool-mist vaporizer/humidifier/steam from hot showers, OTC throat lozenges, advancement of fluids as tolerated, nasal saline sprays, rest, OTC acetaminophen  or ibuprofen for pain control, frequent handwashing.  Follow Up Instructions: I discussed the assessment and treatment plan with the patient. The patient was provided an opportunity to ask questions and all were answered. The patient agreed with the plan and demonstrated an understanding of the instructions.  A copy of instructions were sent to the patient via MyChart unless otherwise noted below.    The patient was advised to call back or seek an in-person evaluation if the symptoms worsen or if the condition fails to improve as anticipated.    Benancio Osmundson, PA-C     [1] No Known Allergies [2]  Current Outpatient Medications:    amLODipine  (NORVASC ) 10 MG tablet, Take 1 tablet (10 mg total) by mouth daily., Disp: 90 tablet, Rfl: 3   aspirin  EC 81 MG tablet, Take 81 mg by mouth daily. Swallow whole., Disp: , Rfl:    atorvastatin  (LIPITOR ) 80 MG tablet, Take 1 tablet (80 mg total) by mouth daily., Disp: 90 tablet, Rfl: 3   gabapentin  (NEURONTIN ) 100 MG capsule, Take 2 capsules (200 mg total) by mouth at bedtime., Disp: 180 capsule, Rfl: 0   metoprolol  succinate (TOPROL  XL) 25 MG 24 hr tablet, Take 1 tablet (25 mg total) by mouth daily., Disp: 90 tablet, Rfl: 3   nitroGLYCERIN  (NITROSTAT ) 0.4 MG SL tablet, Place 1 tablet (0.4 mg total) under the tongue every 5 (five) minutes x 3 doses as needed for chest pain., Disp: 25 tablet, Rfl: 3   tiZANidine  (ZANAFLEX ) 4 MG tablet, Take 1 tablet (4 mg total) by mouth at bedtime., Disp: 30 tablet, Rfl: 0  Current Facility-Administered Medications:    bupivacaine  (MARCAINE ) 0.5 % (with pres) injection 3 mL, 3 mL, Other, Once,   "

## 2024-07-25 NOTE — Procedures (Signed)
 Lumbar Diagnostic Facet Joint Nerve Block with Fluoroscopic Guidance   Patient: Mitchell Mcbride      Date of Birth: 05-Feb-1971 MRN: [id number] PCP: Patient, No Pcp Per      Visit Date: 07/20/2024   Universal Protocol:    Date/Time: 12/22/20256:38 PM  Consent Given By: the patient  Position: PRONE  Additional Comments: Vital signs were monitored before and after the procedure. Patient was prepped and draped in the usual sterile fashion. The correct patient, procedure, and site was verified.   Injection Procedure Details:   Procedure diagnoses:  1. Spondylosis without myelopathy or radiculopathy, lumbar region      Meds Administered:  Meds ordered this encounter  Medications   bupivacaine  (MARCAINE ) 0.5 % (with pres) injection 3 mL     Laterality: Left  Location/Site: L4-L5, L3 and L4 medial branches and L5-S1, L4 medial branch and L5 dorsal ramus  Needle: 5.0 in., 25 ga.  Short bevel or Quincke spinal needle  Needle Placement: Oblique pedical  Findings:   -Comments: There was excellent flow of contrast along the articular pillars without intravascular flow.  Procedure Details: The fluoroscope beam is vertically oriented in AP and then obliqued 15 to 20 degrees to the ipsilateral side of the desired nerve to achieve the Scotty dog appearance.  The skin over the target area of the junction of the superior articulating process and the transverse process (sacral ala if blocking the L5 dorsal rami) was locally anesthetized with a 1 ml volume of 1% Lidocaine  without Epinephrine.  The spinal needle was inserted and advanced in a trajectory view down to the target.   After contact with periosteum and negative aspirate for blood and CSF, correct placement without intravascular or epidural spread was confirmed by injecting 0.5 ml. of Isovue -250.  A spot radiograph was obtained of this image.    Next, a 0.5 ml. volume of the injectate described above was injected. The needle was  then redirected to the other facet joint nerves mentioned above if needed.  Prior to the procedure, the patient was given a Pain Diary which was completed for baseline measurements.  After the procedure, the patient rated their pain every 30 minutes and will continue rating at this frequency for a total of 5 hours.  The patient has been asked to complete the Diary and return to us  by mail, fax or hand delivered as soon as possible.   Additional Comments:  The patient tolerated the procedure well Dressing: 2 x 2 sterile gauze and Band-Aid    Post-procedure details: Patient was observed during the procedure. Post-procedure instructions were reviewed.  Patient left the clinic in stable condition.

## 2024-07-25 NOTE — Progress Notes (Signed)
 "  Mitchell Mcbride - 53 y.o. male MRN [id number]  Date of birth: 1971-06-20  Office Visit Note: Visit Date: 07/20/2024 PCP: Patient, No Pcp Per Referred by: Trudy Duwaine BRAVO, NP  Subjective: Chief Complaint  Patient presents with   Lower Back - Pain   HPI:  Mitchell Mcbride is a 53 y.o. male who comes in today for planned repeat Left L4-5 and L5-S1 Lumbar facet/medial branch block with fluoroscopic guidance.  The patient has failed conservative care including home exercise, medications, time and activity modification.  This injection will be diagnostic and hopefully therapeutic.  Please see requesting physician notes for further details and justification.  Exam shows concordant low back pain with facet joint loading and extension. Patient received more than 80% pain relief from prior injection. This would be the second block in a diagnostic double block paradigm.     Referring:Megan Trudy, FNP and Dr. Arthea Sharps   ROS Otherwise per HPI.  Assessment & Plan: Visit Diagnoses:    ICD-10-CM   1. Spondylosis without myelopathy or radiculopathy, lumbar region  M47.816 XR C-ARM NO REPORT    Nerve Block    bupivacaine  (MARCAINE ) 0.5 % (with pres) injection 3 mL      Plan: No additional findings.   Meds & Orders:  Meds ordered this encounter  Medications   bupivacaine  (MARCAINE ) 0.5 % (with pres) injection 3 mL    Orders Placed This Encounter  Procedures   Nerve Block   XR C-ARM NO REPORT    Follow-up: Return for Review Pain Diary.   Procedures: No procedures performed  Lumbar Diagnostic Facet Joint Nerve Block with Fluoroscopic Guidance   Patient: Mitchell Mcbride      Date of Birth: 1971/02/02 MRN: [id number] PCP: Patient, No Pcp Per      Visit Date: 07/20/2024   Universal Protocol:    Date/Time: 12/22/20256:38 PM  Consent Given By: the patient  Position: PRONE  Additional Comments: Vital signs were monitored before and after the procedure. Patient was prepped  and draped in the usual sterile fashion. The correct patient, procedure, and site was verified.   Injection Procedure Details:   Procedure diagnoses:  1. Spondylosis without myelopathy or radiculopathy, lumbar region      Meds Administered:  Meds ordered this encounter  Medications   bupivacaine  (MARCAINE ) 0.5 % (with pres) injection 3 mL     Laterality: Left  Location/Site: L4-L5, L3 and L4 medial branches and L5-S1, L4 medial branch and L5 dorsal ramus  Needle: 5.0 in., 25 ga.  Short bevel or Quincke spinal needle  Needle Placement: Oblique pedical  Findings:   -Comments: There was excellent flow of contrast along the articular pillars without intravascular flow.  Procedure Details: The fluoroscope beam is vertically oriented in AP and then obliqued 15 to 20 degrees to the ipsilateral side of the desired nerve to achieve the Scotty dog appearance.  The skin over the target area of the junction of the superior articulating process and the transverse process (sacral ala if blocking the L5 dorsal rami) was locally anesthetized with a 1 ml volume of 1% Lidocaine  without Epinephrine.  The spinal needle was inserted and advanced in a trajectory view down to the target.   After contact with periosteum and negative aspirate for blood and CSF, correct placement without intravascular or epidural spread was confirmed by injecting 0.5 ml. of Isovue -250.  A spot radiograph was obtained of this image.    Next, a 0.5 ml. volume of  the injectate described above was injected. The needle was then redirected to the other facet joint nerves mentioned above if needed.  Prior to the procedure, the patient was given a Pain Diary which was completed for baseline measurements.  After the procedure, the patient rated their pain every 30 minutes and will continue rating at this frequency for a total of 5 hours.  The patient has been asked to complete the Diary and return to us  by mail, fax or hand  delivered as soon as possible.   Additional Comments:  The patient tolerated the procedure well Dressing: 2 x 2 sterile gauze and Band-Aid    Post-procedure details: Patient was observed during the procedure. Post-procedure instructions were reviewed.  Patient left the clinic in stable condition.   Clinical History: No specialty comments available.     Objective:  VS:  HT:    WT:   BMI:     BP:121/82  HR:64bpm  TEMP: ( )  RESP:  Physical Exam Vitals and nursing note reviewed.  Constitutional:      General: He is not in acute distress.    Appearance: Normal appearance. He is not ill-appearing.  HENT:     Head: Normocephalic and atraumatic.     Right Ear: External ear normal.     Left Ear: External ear normal.     Nose: No congestion.  Eyes:     Extraocular Movements: Extraocular movements intact.  Cardiovascular:     Rate and Rhythm: Normal rate.     Pulses: Normal pulses.  Pulmonary:     Effort: Pulmonary effort is normal. No respiratory distress.  Abdominal:     General: There is no distension.     Palpations: Abdomen is soft.  Musculoskeletal:        General: No tenderness or signs of injury.     Cervical back: Neck supple.     Right lower leg: No edema.     Left lower leg: No edema.     Comments: Patient has good distal strength without clonus.  Skin:    Findings: No erythema or rash.  Neurological:     General: No focal deficit present.     Mental Status: He is alert and oriented to person, place, and time.     Sensory: No sensory deficit.     Motor: No weakness or abnormal muscle tone.     Coordination: Coordination normal.  Psychiatric:        Mood and Affect: Mood normal.        Behavior: Behavior normal.      Imaging: No results found. "

## 2024-07-26 ENCOUNTER — Ambulatory Visit: Admitting: Family Medicine

## 2024-07-26 ENCOUNTER — Encounter: Payer: Self-pay | Admitting: Physical Medicine and Rehabilitation

## 2024-07-27 ENCOUNTER — Other Ambulatory Visit: Payer: Self-pay | Admitting: Physical Medicine and Rehabilitation

## 2024-07-27 DIAGNOSIS — M47816 Spondylosis without myelopathy or radiculopathy, lumbar region: Secondary | ICD-10-CM

## 2024-07-27 DIAGNOSIS — M545 Low back pain, unspecified: Secondary | ICD-10-CM

## 2024-08-11 ENCOUNTER — Other Ambulatory Visit: Payer: Self-pay | Admitting: Physical Medicine and Rehabilitation

## 2024-08-11 MED ORDER — DIAZEPAM 5 MG PO TABS: ORAL_TABLET | ORAL | 0 refills | Status: AC

## 2024-08-15 ENCOUNTER — Encounter: Payer: Self-pay | Admitting: Family Medicine

## 2024-08-15 ENCOUNTER — Ambulatory Visit: Admitting: Family Medicine

## 2024-08-15 ENCOUNTER — Other Ambulatory Visit: Payer: Self-pay

## 2024-08-15 VITALS — BP 122/70 | HR 69 | Ht 71.0 in | Wt 211.0 lb

## 2024-08-15 DIAGNOSIS — M79602 Pain in left arm: Secondary | ICD-10-CM | POA: Diagnosis not present

## 2024-08-15 DIAGNOSIS — S52125A Nondisplaced fracture of head of left radius, initial encounter for closed fracture: Secondary | ICD-10-CM | POA: Diagnosis not present

## 2024-08-15 DIAGNOSIS — S52122A Displaced fracture of head of left radius, initial encounter for closed fracture: Secondary | ICD-10-CM | POA: Insufficient documentation

## 2024-08-15 MED ORDER — VITAMIN D (ERGOCALCIFEROL) 1.25 MG (50000 UNIT) PO CAPS: 50000.0000 [IU] | ORAL_CAPSULE | ORAL | 0 refills | Status: AC

## 2024-08-15 NOTE — Patient Instructions (Addendum)
 Good to see you. Ice 20 min every 3 to 4 hours. Vitamin D  once weekly.  After 72 hours start moving it where you can. No lifting more than 5 pounds.  See me again in 2 weeks.

## 2024-08-15 NOTE — Progress Notes (Signed)
 " [facility] [address] [place] [address] Phone: [phone] Subjective:    I'm seeing this patient by the request  of:  Patient, No Pcp Per  CC: Left arm pain after fall  YEP:Dlagzrupcz  05/26/2024 Due to patient having cardiovascular risk factors I do think that injections are to be the more beneficial instead of adding anti-inflammatories long-term for this individual.  Do think that patient will do relatively well with it.  Discussed icing regimen and home exercises, do think that we will refer him for what would be more of a medial branch block that could be helpful.  Patient's MRI did show facet arthropathy as well that could be contributing to some of the discomfort and pain.  Increase activity slowly.  Depending patient response to the medial branch block could be a candidate for radiofrequency ablation.  Seems to be more with long-term standing and no longer having radicular symptoms.  Patient is in agreement with the plan.  Follow-up with me again     Updated 08/15/2024 Mcbride Mcbride is a 54 y.o. male coming in with complaint of L arm pain after fall yesterday. Patient states that he fell off the ladder. Went to ER, got x-rays, and given medication. Ribs bilateral pain, left arm hurts, left thigh hurts. Swelling in arm and hands. Hospital gave him muscle relaxer and 800 mg Ibuprofen.       Past Medical History:  Diagnosis Date   Hyperlipidemia    Kidney stones    Past Surgical History:  Procedure Laterality Date   CARDIAC CATHETERIZATION     CORONARY STENT INTERVENTION N/A 03/04/2023   Procedure: CORONARY STENT INTERVENTION;  Surgeon: Verlin Lonni BIRCH, MD;  Location: [facility];  Service: Cardiovascular;  Laterality: N/A;   CORONARY ULTRASOUND/IVUS N/A 03/04/2023   Procedure: Coronary Ultrasound/IVUS;  Surgeon: Verlin Lonni BIRCH, MD;  Location: [facility];  Service: Cardiovascular;  Laterality: N/A;    fracture right ankle     LEFT HEART CATH AND CORONARY ANGIOGRAPHY N/A 03/04/2023   Procedure: LEFT HEART CATH AND CORONARY ANGIOGRAPHY;  Surgeon: Verlin Lonni BIRCH, MD;  Location: [facility];  Service: Cardiovascular;  Laterality: N/A;   ORIF HUMERUS FRACTURE Right 03/07/2014   Procedure: OPEN REDUCTION INTERNAL FIXATION (ORIF) DISTAL HUMERUS FRACTURE;  Surgeon: Elsie Mussel, MD;  Location: [facility];  Service: Orthopedics;  Laterality: Right;   stent for kidney stones     Social History   Socioeconomic History   Marital status: Married    Spouse name: Not on file   Number of children: Not on file   Years of education: Not on file   Highest education level: Not on file  Occupational History   Not on file  Tobacco Use   Smoking status: Former    Current packs/day: 0.00    Types: Cigarettes    Quit date: 08/05/1991    Years since quitting: 33.0   Smokeless tobacco: Former    Quit date: 03/08/2011  Substance and Sexual Activity   Alcohol use: Yes    Alcohol/week: 5.0 standard drinks of alcohol    Types: 5 Standard drinks or equivalent per week    Comment: social   Drug use: No   Sexual activity: Not on file  Other Topics Concern   Not on file  Social History Narrative   Not on file   Social Drivers of Health   Tobacco Use: Medium Risk (07/23/2024)   Patient  History    Smoking Tobacco Use: Former    Smokeless Tobacco Use: Former    Passive Exposure: Not on Stage Manager: Not on Ship [employer] Insecurity: Not on file  Transportation Needs: Not on file  Physical Activity: Not on file  Stress: Not on file  Social Connections: Not on file  Depression (PHQ2-9): Not on file  Alcohol Screen: Not on file  Housing: Not on file  Utilities: Not on file  Health Literacy: Not on file   Allergies[1] Family History  Problem Relation Age of Onset   Heart attack Father     Current Outpatient Medications (Cardiovascular):    amLODipine  (NORVASC ) 10 MG  tablet, Take 1 tablet (10 mg total) by mouth daily.   atorvastatin  (LIPITOR ) 80 MG tablet, Take 1 tablet (80 mg total) by mouth daily.   metoprolol  succinate (TOPROL  XL) 25 MG 24 hr tablet, Take 1 tablet (25 mg total) by mouth daily.   nitroGLYCERIN  (NITROSTAT ) 0.4 MG SL tablet, Place 1 tablet (0.4 mg total) under the tongue every 5 (five) minutes x 3 doses as needed for chest pain.  Current Outpatient Medications (Respiratory):    fluticasone  (FLONASE ) 50 MCG/ACT nasal spray, Place 2 sprays into both nostrils daily.  Current Outpatient Medications (Analgesics):    aspirin  EC 81 MG tablet, Take 81 mg by mouth daily. Swallow whole.  Current Outpatient Medications (Other):    Vitamin D , Ergocalciferol , (DRISDOL ) 1.25 MG (50000 UNIT) CAPS capsule, Take 1 capsule (50,000 Units total) by mouth every 7 (seven) days.   diazepam  (VALIUM ) 5 MG tablet, Take one tablet by mouth with light food one hour prior to procedure.   gabapentin  (NEURONTIN ) 100 MG capsule, Take 2 capsules (200 mg total) by mouth at bedtime.   tiZANidine  (ZANAFLEX ) 4 MG tablet, Take 1 tablet (4 mg total) by mouth at bedtime.   Reviewed prior external information including notes and imaging from  primary care provider As well as notes that were available from care everywhere and other healthcare systems.  Past medical history, social, surgical and family history all reviewed in electronic medical record.  No pertanent information unless stated regarding to the chief complaint.   Review of Systems:  No headache, visual changes, nausea, vomiting, diarrhea, constipation, dizziness, abdominal pain, skin rash, fevers, chills, night sweats, weight loss, swollen lymph nodes, body aches, chest pain, shortness of breath, mood changes. POSITIVE muscle aches, joint swelling  Objective  Blood pressure 122/70, pulse 69, height 5' 11 (1.803 m), weight 211 lb (95.7 kg), SpO2 97%.   General: No apparent distress alert and oriented x3 mood  and affect normal, dressed appropriately.  HEENT: Pupils equal, extraocular movements intact  Respiratory: Patient's speak in full sentences and does not appear short of breath  Cardiovascular: No lower extremity edema, non tender, no erythema  Left elbow does have some tenderness to palpation noted.  Does have swelling.  Limited range of motion fairly significant.  Limited muscular skeletal ultrasound was performed and interpreted by Mcbride Mcbride, M  Limited ultrasound of patient's left elbow shows a nondisplaced radial head fracture noted does not appear to be at the anatomical neck.  Significant effusion noted that is fairly large mostly of the posterior capsule of the elbow. Impression: Radial head fracture with joint effusion    Impression and Recommendations:     The above documentation has been reviewed and is accurate and complete Mcbride Mcbride M Mcbride Mayfield, DO       [1] No Known Allergies  "

## 2024-08-15 NOTE — Assessment & Plan Note (Signed)
 Appears to be a nondisplaced fracture of the radial head.  Not seen on x-rays yesterday per reports.  Will try to get imaging.  Patient does have significant effusion of the elbow noted.  We discussed with patient about icing and then slowly range of motion.  Once weekly vitamin D  given.  Discussed icing regimen.  Patient will follow-up with me again in 2 weeks where we will get repeat x-rays to make sure good callus formation as well as make sure to decrease swelling.  Discussed with signs and symptoms such as discoloration, worsening pain to seek medical attention.  Patient is in agreement with the plan.

## 2024-08-16 ENCOUNTER — Ambulatory Visit: Admitting: Sports Medicine

## 2024-08-24 ENCOUNTER — Ambulatory Visit: Admitting: Physical Medicine and Rehabilitation

## 2024-08-24 ENCOUNTER — Other Ambulatory Visit: Payer: Self-pay

## 2024-08-24 VITALS — BP 145/88

## 2024-08-24 DIAGNOSIS — M47816 Spondylosis without myelopathy or radiculopathy, lumbar region: Secondary | ICD-10-CM

## 2024-08-24 MED ORDER — METHYLPREDNISOLONE ACETATE 40 MG/ML IJ SUSP
40.0000 mg | Freq: Once | INTRAMUSCULAR | Status: AC
  Administered 2024-08-24: 40 mg

## 2024-08-24 NOTE — Progress Notes (Signed)
 Pain Scale   Average Pain 2 Patient advising he has chronic lower back pain that increases when moving and working.Pain comes and goes.        +Driver, -BT, -Dye Allergies.

## 2024-08-30 NOTE — Progress Notes (Signed)
 "  Mitchell Mcbride - 54 y.o. male MRN [id number]  Date of birth: 03/31/1971  Office Visit Note: Visit Date: 08/24/2024 PCP: Patient, No Pcp Per Referred by: Trudy Duwaine BRAVO, NP  Subjective: Chief Complaint  Patient presents with   Lower Back - Pain   HPI:  Mitchell Mcbride is a 54 y.o. male who comes in todayfor planned radiofrequency ablation of the Left L4-5 and L5-S1 Lumbar facet joints. This would be ablation of the corresponding medial branches and/or dorsal rami.  Patient has had double diagnostic blocks with more than 50% relief.  These are documented on pain diary.  They have had chronic back pain for quite some time, more than 3 months, which has been an ongoing situation with recalcitrant axial back pain.  They have no radicular pain.  Their axial pain is worse with standing and ambulating and on exam today with facet loading.  They have had physical therapy as well as home exercise program.  The imaging noted in the chart below indicated facet pathology. Accordingly they meet all the criteria and qualification for for radiofrequency ablation and we are going to complete this today hopefully for more longer term relief as part of comprehensive management program.   ROS Otherwise per HPI.  Assessment & Plan: Visit Diagnoses:    ICD-10-CM   1. Spondylosis without myelopathy or radiculopathy, lumbar region  M47.816 XR C-ARM NO REPORT    Radiofrequency,Lumbar    methylPREDNISolone  acetate (DEPO-MEDROL ) injection 40 mg      Plan: No additional findings.   Meds & Orders:  Meds ordered this encounter  Medications   methylPREDNISolone  acetate (DEPO-MEDROL ) injection 40 mg    Orders Placed This Encounter  Procedures   Radiofrequency,Lumbar   XR C-ARM NO REPORT    Follow-up: Return for visit to requesting provider as needed.   Procedures: No procedures performed  Lumbar Facet Joint Nerve Denervation  Patient: Mitchell Mcbride      Date of Birth: October 01, 1970 MRN:  [id number] PCP: Patient, No Pcp Per      Visit Date: 08/24/2024   Universal Protocol:    Date/Time: 01/27/269:50 AM  Consent Given By: the patient  Position: PRONE  Additional Comments: Vital signs were monitored before and after the procedure. Patient was prepped and draped in the usual sterile fashion. The correct patient, procedure, and site was verified.   Injection Procedure Details:   Procedure diagnoses:  1. Spondylosis without myelopathy or radiculopathy, lumbar region      Meds Administered:  Meds ordered this encounter  Medications   methylPREDNISolone  acetate (DEPO-MEDROL ) injection 40 mg     Laterality: Left  Location/Site:  L4-L5, L3 and L4 medial branches and L5-S1, L4 medial branch and L5 dorsal ramus  Needle: 18 ga.,  10mm active tip, RF Cannula  Needle Placement: Along juncture of superior articular process and transverse pocess  Findings:  -Comments:  Procedure Details: For each desired target nerve, the corresponding transverse process (sacral ala for the L5 dorsal rami) was identified and the fluoroscope was positioned to square off the endplates of the corresponding vertebral body to achieve a true AP midline view.  The beam was then obliqued 15 to 20 degrees and caudally tilted 15 to 20 degrees to line up a trajectory along the target nerves. The skin over the target of the junction of superior articulating process and transverse process (sacral ala for the L5 dorsal rami) was infiltrated with 1ml of 1% Lidocaine  without Epinephrine.  The 18  gauge 10mm active tip outer cannula was advanced in trajectory view to the target.  This procedure was repeated for each target nerve.  Then, for all levels, the outer cannula placement was fine-tuned and the position was then confirmed with bi-planar imaging.    Test stimulation was done both at sensory and motor levels to ensure there was no radicular stimulation. The target tissues were then infiltrated  with 1 ml of 1% Lidocaine  without Epinephrine. Subsequently, a percutaneous neurotomy was carried out for 90 seconds at 80 degrees Celsius.  After the completion of the lesion, 1 ml of injectate was delivered. It was then repeated for each facet joint nerve mentioned above. Appropriate radiographs were obtained to verify the probe placement during the neurotomy.   Additional Comments:  The patient tolerated the procedure well Dressing: 2 x 2 sterile gauze and Band-Aid    Post-procedure details: Patient was observed during the procedure. Post-procedure instructions were reviewed.  Patient left the clinic in stable condition.      Clinical History: MRI LUMBAR SPINE WITHOUT CONTRAST   TECHNIQUE: Multiplanar, multisequence MR imaging of the lumbar spine was performed. No intravenous contrast was administered.   COMPARISON:  None Available.   FINDINGS: Segmentation: Standard.   Alignment:  Physiologic lumbar alignment is maintained.   Vertebrae: Vertebral bodies demonstrate normal signal intensity. No compression fractures.   Conus medullaris and cauda equina: The conus medullaris terminates at the level of L1-L2. The distal spinal cord signal intensity is normal.   Paraspinal and other soft tissues: The visualized abdomen and pelvis show no soft tissue abnormality. The visualized aorta is normal.   Disc levels:   L1-L2: Disc is normal in configuration. Mild bilateral facet arthropathy. No neuroforaminal stenosis. No spinal canal stenosis.   L2-L3: Disc is normal in configuration. Mild bilateral facet arthropathy. No neuroforaminal stenosis. No spinal canal stenosis.   L3-L4: Mild disc bulge. Mild facet arthropathy. No neuroforaminal stenosis. No spinal canal stenosis.   L4-L5: Mild disc bulge. Moderate bilateral facet arthropathy. No neuroforaminal stenosis. No spinal canal stenosis.   L5-S1: Disc bulge with left central/subarticular disc extrusion. Moderate  bilateral facet arthropathy. No neuroforaminal stenosis. Mild spinal canal stenosis and narrowing of the left subarticular zone with abutment of the traversing left S1 nerve root.   IMPRESSION: Left central/subarticular disc extrusion at L5-S1 narrows the left subarticular zone and abuts the traversing left S1 nerve root. Mild canal stenosis at this level.     Electronically Signed   By: Clem Savory M.D.   On: 04/22/2024     Objective:  VS:  HT:    WT:   BMI:     BP:(!) 145/88  HR: bpm  TEMP: ( )  RESP:  Physical Exam Vitals and nursing note reviewed.  Constitutional:      General: He is not in acute distress.    Appearance: Normal appearance. He is not ill-appearing.  HENT:     Head: Normocephalic and atraumatic.     Right Ear: External ear normal.     Left Ear: External ear normal.     Nose: No congestion.  Eyes:     Extraocular Movements: Extraocular movements intact.  Cardiovascular:     Rate and Rhythm: Normal rate.     Pulses: Normal pulses.  Pulmonary:     Effort: Pulmonary effort is normal. No respiratory distress.  Abdominal:     General: There is no distension.     Palpations: Abdomen is soft.  Musculoskeletal:  General: No tenderness or signs of injury.     Cervical back: Neck supple.     Right lower leg: No edema.     Left lower leg: No edema.     Comments: Patient has good distal strength without clonus.  Skin:    Findings: No erythema or rash.  Neurological:     General: No focal deficit present.     Mental Status: He is alert and oriented to person, place, and time.     Sensory: No sensory deficit.     Motor: No weakness or abnormal muscle tone.     Coordination: Coordination normal.  Psychiatric:        Mood and Affect: Mood normal.        Behavior: Behavior normal.      Imaging: No results found. "

## 2024-08-30 NOTE — Progress Notes (Unsigned)
 " [name] [address] [place] [address] Phone: [phone] Subjective:   LILLETTE Berwyn Posey, am serving as a scribe for Dr. Arthea Claudene.  I'm seeing this patient by the request  of:  Patient, No Pcp Per  CC: Left arm pain  YEP:Dlagzrupcz  08/15/2024 Appears to be a nondisplaced fracture of the radial head.  Not seen on x-rays yesterday per reports.  Will try to get imaging.  Patient does have significant effusion of the elbow noted.  We discussed with patient about icing and then slowly range of motion.  Once weekly vitamin D  given.  Discussed icing regimen.  Patient will follow-up with me again in 2 weeks where we will get repeat x-rays to make sure good callus formation as well as make sure to decrease swelling.  Discussed with signs and symptoms such as discoloration, worsening pain to seek medical attention.  Patient is in agreement with the plan.      Update 08/31/2024 Javis E Watts is a 54 y.o. male coming in with complaint of L arm pain.  He was found to have a radial head fracture.  He wants to do conservative therapy.  Has had a back injection since we have seen him.  Patient states that he is improving but still having pain. Does have pain in shoulder and wrist.       Past Medical History:  Diagnosis Date   Hyperlipidemia    Kidney stones    Past Surgical History:  Procedure Laterality Date   CARDIAC CATHETERIZATION     CORONARY STENT INTERVENTION N/A 03/04/2023   Procedure: CORONARY STENT INTERVENTION;  Surgeon: Verlin Lonni BIRCH, MD;  Location: [facility];  Service: Cardiovascular;  Laterality: N/A;   CORONARY ULTRASOUND/IVUS N/A 03/04/2023   Procedure: Coronary Ultrasound/IVUS;  Surgeon: Verlin Lonni BIRCH, MD;  Location: [facility];  Service: Cardiovascular;  Laterality: N/A;   fracture right ankle     LEFT HEART CATH AND CORONARY ANGIOGRAPHY N/A 03/04/2023   Procedure: LEFT HEART CATH AND CORONARY  ANGIOGRAPHY;  Surgeon: Verlin Lonni BIRCH, MD;  Location: [facility];  Service: Cardiovascular;  Laterality: N/A;   ORIF HUMERUS FRACTURE Right 03/07/2014   Procedure: OPEN REDUCTION INTERNAL FIXATION (ORIF) DISTAL HUMERUS FRACTURE;  Surgeon: Elsie Mussel, MD;  Location: [facility];  Service: Orthopedics;  Laterality: Right;   stent for kidney stones     Social History   Socioeconomic History   Marital status: Married    Spouse name: Not on file   Number of children: Not on file   Years of education: Not on file   Highest education level: Not on file  Occupational History   Not on file  Tobacco Use   Smoking status: Former    Current packs/day: 0.00    Types: Cigarettes    Quit date: 08/05/1991    Years since quitting: 33.0   Smokeless tobacco: Former    Quit date: 03/08/2011  Substance and Sexual Activity   Alcohol use: Yes    Alcohol/week: 5.0 standard drinks of alcohol    Types: 5 Standard drinks or equivalent per week    Comment: social   Drug use: No   Sexual activity: Not on file  Other Topics Concern   Not on file  Social History Narrative   Not on file   Social Drivers of Health   Tobacco Use: Medium Risk (08/15/2024)   Patient History    Smoking Tobacco Use:  Former    Smokeless Tobacco Use: Former    Passive Exposure: Not on Stage Manager: Not on Ship [employer] Insecurity: Not on file  Transportation Needs: Not on file  Physical Activity: Not on file  Stress: Not on file  Social Connections: Not on file  Depression (PHQ2-9): Not on file  Alcohol Screen: Not on file  Housing: Not on file  Utilities: Not on file  Health Literacy: Not on file   Allergies[1] Family History  Problem Relation Age of Onset   Heart attack Father     Current Outpatient Medications (Cardiovascular):    amLODipine  (NORVASC ) 10 MG tablet, Take 1 tablet (10 mg total) by mouth daily.   atorvastatin  (LIPITOR ) 80 MG tablet, Take 1 tablet (80 mg total) by mouth  daily.   metoprolol  succinate (TOPROL  XL) 25 MG 24 hr tablet, Take 1 tablet (25 mg total) by mouth daily.   nitroGLYCERIN  (NITROSTAT ) 0.4 MG SL tablet, Place 1 tablet (0.4 mg total) under the tongue every 5 (five) minutes x 3 doses as needed for chest pain.  Current Outpatient Medications (Respiratory):    fluticasone  (FLONASE ) 50 MCG/ACT nasal spray, Place 2 sprays into both nostrils daily.  Current Outpatient Medications (Analgesics):    aspirin  EC 81 MG tablet, Take 81 mg by mouth daily. Swallow whole.  Current Outpatient Medications (Other):    diazepam  (VALIUM ) 5 MG tablet, Take one tablet by mouth with light food one hour prior to procedure.   tiZANidine  (ZANAFLEX ) 4 MG tablet, Take 1 tablet (4 mg total) by mouth at bedtime.   Vitamin D , Ergocalciferol , (DRISDOL ) 1.25 MG (50000 UNIT) CAPS capsule, Take 1 capsule (50,000 Units total) by mouth every 7 (seven) days.   Reviewed prior external information including notes and imaging from  primary care provider As well as notes that were available from care everywhere and other healthcare systems.  Past medical history, social, surgical and family history all reviewed in electronic medical record.  No pertanent information unless stated regarding to the chief complaint.   Review of Systems:  No headache, visual changes, nausea, vomiting, diarrhea, constipation, dizziness, abdominal pain, skin rash, fevers, chills, night sweats, weight loss, swollen lymph nodes, body aches, joint swelling, chest pain, shortness of breath, mood changes. POSITIVE muscle aches  Objective  Blood pressure 116/76, pulse 77, height 5' 11 (1.803 m), weight 205 lb (93 kg), SpO2 98%.   General: No apparent distress alert and oriented x3 mood and affect normal, dressed appropriately.  HEENT: Pupils equal, extraocular movements intact  Respiratory: Patient's speak in full sentences and does not appear short of breath  Cardiovascular: No lower extremity edema, non  tender, no erythema  Left elbow exam shows significant less swelling.  Improvement in range of motion still lacking last 10 degrees of flexion in the last 10 degrees of supination but full pronation and full extension which is an improvement.  Neurovascularly intact distally.  Good grip strength.  Limited muscular skeletal ultrasound was performed and interpreted by CLAUDENE HUSSAR, M  Limited ultrasound shows that patient does have a soft and even a little bit of a hard callus over the radial head fracture that was noted previously as well as Callus over the radial neck.  Significant decrease in the hypoechoic changes that was previously noted.    Impression and Recommendations:     The above documentation has been reviewed and is accurate and complete Revis Whalin M Seila Liston, DO       [1] No  Known Allergies  "

## 2024-08-30 NOTE — Procedures (Signed)
 Lumbar Facet Joint Nerve Denervation  Patient: Mitchell Mcbride      Date of Birth: [birth year] MRN: [id number] PCP: Patient, No Pcp Per      Visit Date: 08/24/2024   Universal Protocol:    Date/Time: 01/27/269:50 AM  Consent Given By: the patient  Position: PRONE  Additional Comments: Vital signs were monitored before and after the procedure. Patient was prepped and draped in the usual sterile fashion. The correct patient, procedure, and site was verified.   Injection Procedure Details:   Procedure diagnoses:  1. Spondylosis without myelopathy or radiculopathy, lumbar region      Meds Administered:  Meds ordered this encounter  Medications   methylPREDNISolone  acetate (DEPO-MEDROL ) injection 40 mg     Laterality: Left  Location/Site:  L4-L5, L3 and L4 medial branches and L5-S1, L4 medial branch and L5 dorsal ramus  Needle: 18 ga.,  10mm active tip, RF Cannula  Needle Placement: Along juncture of superior articular process and transverse pocess  Findings:  -Comments:  Procedure Details: For each desired target nerve, the corresponding transverse process (sacral ala for the L5 dorsal rami) was identified and the fluoroscope was positioned to square off the endplates of the corresponding vertebral body to achieve a true AP midline view.  The beam was then obliqued 15 to 20 degrees and caudally tilted 15 to 20 degrees to line up a trajectory along the target nerves. The skin over the target of the junction of superior articulating process and transverse process (sacral ala for the L5 dorsal rami) was infiltrated with 1ml of 1% Lidocaine  without Epinephrine.  The 18 gauge 10mm active tip outer cannula was advanced in trajectory view to the target.  This procedure was repeated for each target nerve.  Then, for all levels, the outer cannula placement was fine-tuned and the position was then confirmed with bi-planar imaging.    Test stimulation was done both at sensory and  motor levels to ensure there was no radicular stimulation. The target tissues were then infiltrated with 1 ml of 1% Lidocaine  without Epinephrine. Subsequently, a percutaneous neurotomy was carried out for 90 seconds at 80 degrees Celsius.  After the completion of the lesion, 1 ml of injectate was delivered. It was then repeated for each facet joint nerve mentioned above. Appropriate radiographs were obtained to verify the probe placement during the neurotomy.   Additional Comments:  The patient tolerated the procedure well Dressing: 2 x 2 sterile gauze and Band-Aid    Post-procedure details: Patient was observed during the procedure. Post-procedure instructions were reviewed.  Patient left the clinic in stable condition.

## 2024-09-01 ENCOUNTER — Ambulatory Visit

## 2024-09-01 ENCOUNTER — Ambulatory Visit: Admitting: Family Medicine

## 2024-09-01 ENCOUNTER — Encounter: Payer: Self-pay | Admitting: Family Medicine

## 2024-09-01 ENCOUNTER — Other Ambulatory Visit: Payer: Self-pay

## 2024-09-01 VITALS — BP 116/76 | HR 77 | Ht 71.0 in | Wt 205.0 lb

## 2024-09-01 DIAGNOSIS — M25522 Pain in left elbow: Secondary | ICD-10-CM

## 2024-09-01 DIAGNOSIS — S52125A Nondisplaced fracture of head of left radius, initial encounter for closed fracture: Secondary | ICD-10-CM

## 2024-09-01 NOTE — Patient Instructions (Addendum)
 Be careful with shoveling Brace for elbow AC jt will be ok-Ice and Voltaren  See me in 3 weeks-Ok to double book  [email or web address]2OqWepdtCWHGZNKwtWdJLWB2g9vN-mCcwhDE0cAGfKHx7GAj39EaFIynF0Yvl2XrwfabD1vzOuwTjMq42p7OwZF6s4V4LMG6IxV40F9-TzYbbvV7WkuT4wuK-NcjFqDJ2WvOgdasBHd9lpOjb_k14mI3kxjGIKw5iHARdIps6rg4UDqkXKWjEmLX_PlY9LpAkQab80s8vd9iejso5ybp0yGiNPLEJYzGnf1SKvnlI4Ckw.7BtatxDMQmOmEEWjS0WiIz6Or0nRJMRx1pqjNb1Rl3c&dib_tag=se&keywords=elbow+brace&[phone]&sprefix=elbow+brac%2Caps%2C137&sr=8-11-spons&sp_csd=d2lkZ2V0TmFtZT1zcF9tdGY&psc=1

## 2024-09-01 NOTE — Assessment & Plan Note (Signed)
 P actually also have a radial neck fracture.  Minor displacement compared to previous imaging.  We discussed with patient thoroughly that he has made some improvement in the swelling, range of motion, I encouraged him to be very careful with the snow coming in this weekend.  We discussed we could consider advanced imaging.  Patient would like to continue with conservative therapy and with that add like to see him again in 3 weeks.  Ultrasound does show that there is improvement with some callus formation at this point over the radial head and radial neck fractures.

## 2024-09-05 ENCOUNTER — Ambulatory Visit: Payer: Self-pay | Admitting: Family Medicine

## 2024-09-22 ENCOUNTER — Ambulatory Visit: Admitting: Family Medicine
